# Patient Record
Sex: Male | Born: 1980 | Race: White | Hispanic: No | Marital: Single | Smoking: Never smoker
Health system: Southern US, Community
[De-identification: ages and names within clinical notes are randomized; demographics above are authoritative.]

## PROBLEM LIST (undated history)

## (undated) DIAGNOSIS — J9383 Other pneumothorax: Secondary | ICD-10-CM

## (undated) HISTORY — PX: LUNG SURGERY: SHX703

## (undated) HISTORY — PX: NASAL POLYP SURGERY: SHX186

## (undated) HISTORY — PX: CHEST TUBE INSERTION: SHX231

## (undated) HISTORY — DX: Other pneumothorax: J93.83

---

## 2019-04-21 ENCOUNTER — Other Ambulatory Visit: Payer: Self-pay | Admitting: Orthopedic Surgery

## 2019-04-21 DIAGNOSIS — M533 Sacrococcygeal disorders, not elsewhere classified: Secondary | ICD-10-CM

## 2019-05-12 ENCOUNTER — Ambulatory Visit
Admission: RE | Admit: 2019-05-12 | Discharge: 2019-05-12 | Disposition: A | Discharge status: Home / Self Care | Payer: Worker's Compensation | Source: Ambulatory Visit | Attending: Orthopedic Surgery | Admitting: Orthopedic Surgery

## 2019-05-12 ENCOUNTER — Other Ambulatory Visit: Payer: Self-pay

## 2019-05-12 DIAGNOSIS — M533 Sacrococcygeal disorders, not elsewhere classified: Secondary | ICD-10-CM

## 2019-05-27 ENCOUNTER — Encounter: Payer: Self-pay | Admitting: Physical Medicine & Rehabilitation

## 2019-05-27 ENCOUNTER — Encounter
Payer: Worker's Compensation | Attending: Physical Medicine & Rehabilitation | Admitting: Physical Medicine & Rehabilitation

## 2019-05-27 ENCOUNTER — Other Ambulatory Visit: Payer: Self-pay

## 2019-05-27 VITALS — BP 125/92 | HR 90 | Temp 98.2°F | Ht 73.0 in | Wt 222.0 lb

## 2019-05-27 DIAGNOSIS — R29898 Other symptoms and signs involving the musculoskeletal system: Secondary | ICD-10-CM

## 2019-05-27 DIAGNOSIS — M79605 Pain in left leg: Secondary | ICD-10-CM | POA: Diagnosis present

## 2019-05-27 DIAGNOSIS — M79604 Pain in right leg: Secondary | ICD-10-CM | POA: Insufficient documentation

## 2019-05-27 DIAGNOSIS — R208 Other disturbances of skin sensation: Secondary | ICD-10-CM | POA: Diagnosis present

## 2019-05-27 NOTE — Progress Notes (Signed)
Please see procedure tab for results

## 2019-07-22 ENCOUNTER — Other Ambulatory Visit: Payer: Self-pay | Admitting: Orthopedic Surgery

## 2019-07-22 DIAGNOSIS — G8929 Other chronic pain: Secondary | ICD-10-CM

## 2019-07-22 DIAGNOSIS — M545 Low back pain, unspecified: Secondary | ICD-10-CM

## 2019-07-27 ENCOUNTER — Other Ambulatory Visit: Payer: Self-pay | Admitting: Orthopedic Surgery

## 2019-07-27 DIAGNOSIS — G8929 Other chronic pain: Secondary | ICD-10-CM

## 2019-07-27 DIAGNOSIS — M545 Low back pain, unspecified: Secondary | ICD-10-CM

## 2019-08-07 ENCOUNTER — Other Ambulatory Visit: Payer: Self-pay

## 2019-08-07 ENCOUNTER — Ambulatory Visit
Admission: RE | Admit: 2019-08-07 | Discharge: 2019-08-07 | Disposition: A | Discharge status: Home / Self Care | Payer: Worker's Compensation | Source: Ambulatory Visit | Attending: Orthopedic Surgery | Admitting: Orthopedic Surgery

## 2019-08-07 ENCOUNTER — Other Ambulatory Visit: Payer: Self-pay | Admitting: Orthopedic Surgery

## 2019-08-07 DIAGNOSIS — G8929 Other chronic pain: Secondary | ICD-10-CM

## 2019-08-07 DIAGNOSIS — M545 Low back pain, unspecified: Secondary | ICD-10-CM

## 2019-08-07 MED ORDER — SODIUM CHLORIDE 0.9 % IV SOLN
Freq: Once | INTRAVENOUS | Status: AC
  Administered 2019-08-07: 08:00:00 via INTRAVENOUS

## 2019-08-07 MED ORDER — FENTANYL CITRATE (PF) 100 MCG/2ML IJ SOLN
25.0000 ug | INTRAMUSCULAR | Status: DC | PRN
  Administered 2019-08-07: 50 ug via INTRAVENOUS

## 2019-08-07 MED ORDER — IOPAMIDOL (ISOVUE-M 200) INJECTION 41%
3.5000 mL | Freq: Once | INTRAMUSCULAR | Status: AC
  Administered 2019-08-07: 3.5 mL

## 2019-08-07 MED ORDER — CEFAZOLIN SODIUM-DEXTROSE 2-4 GM/100ML-% IV SOLN
2.0000 g | INTRAVENOUS | Status: AC
  Administered 2019-08-07: 2 g via INTRAVENOUS

## 2019-08-07 MED ORDER — MIDAZOLAM HCL 2 MG/2ML IJ SOLN: 1.0000 mg | INTRAMUSCULAR | Status: DC | PRN

## 2019-08-07 MED ORDER — KETOROLAC TROMETHAMINE 30 MG/ML IJ SOLN
30.0000 mg | Freq: Once | INTRAMUSCULAR | Status: AC
  Administered 2019-08-07: 30 mg via INTRAVENOUS

## 2019-08-07 NOTE — Discharge Instructions (Signed)
Discogram Post Procedure Discharge Instructions ° °1. May resume a regular diet and any medications that you routinely take (including pain medications). °2. No driving day of procedure. °3. Upon discharge go home and rest for at least 4 hours.  May use an ice pack as needed to injection sites on back.  Ice to back 30 minutes on and 30 minutes off, all day. °4. May remove bandades later, today. °5. It is not unusual to be sore for several days after this procedure. ° ° ° °Please contact our office at [phone] for the following symptoms: ° °· Fever greater than 100 degrees °· Increased swelling, pain, or redness at injection site. ° ° °Thank you for visiting [facility]. ° ° °

## 2019-08-07 NOTE — Progress Notes (Signed)
Pt currently resting in nurses station. Pt tolerated procedure well. Is currently awake and speaking in complete sentences.

## 2019-08-26 ENCOUNTER — Other Ambulatory Visit: Payer: Self-pay | Admitting: Orthopedic Surgery

## 2019-09-04 NOTE — Pre-Procedure Instructions (Addendum)
[place], [place] [address] [place] [address] Phone: [phone] Fax: [phone]      Your procedure is scheduled on  09-09-19 Wednesday  Report to [facility] Main Entrance "A" at [place].M., and check in at the Admitting office.  Call this number if you have problems the morning of surgery:  [phone]  Call [phone] if you have any questions prior to your surgery date Monday-Friday 8am-4pm    Remember:  Do not eat or drink after midnight the night before your surgery  You may drink clear liquids until 0530 AM the morning of your surgery.   Clear liquids allowed are: Water, Non-Citrus Juices (without pulp), Carbonated Beverages, Clear Tea, Black Coffee Only, and Gatorade  Please complete your PRE-SURGERY ENSURE that was provided to you by 0530AM the morning of surgery.  Please, if able, drink it in one setting. DO NOT SIP.   Take these medicines the morning of surgery with A SIP OF WATER : acetaminophen-codeine (TYLENOL #3 as needed methocarbamol (ROBAXIN) as needed cyclobenzaprine (FLEXERIL) as needed cetirizine (ZYRTEC) as needed  7 days prior to surgery STOP taking any Aspirin (unless otherwise instructed by your surgeon), Aleve, Naproxen, Ibuprofen, Motrin, Advil, Goody's, BC's, all herbal medications, fish oil, and all vitamins.    The Morning of Surgery  Do not wear jewelry, make-up or nail polish.  Do not wear lotions, powders, or perfumes/colognes, or deodorant   Men may shave face and neck.  Do not bring valuables to the hospital.  [facility] is not responsible for any belongings or valuables.  If you are a smoker, DO NOT Smoke 24 hours prior to surgery IF you wear a CPAP at night please bring your mask, tubing, and machine the morning of surgery   Remember that you must have someone to transport you home after your surgery, and remain with you for 24 hours if you are discharged the same  day.   Contacts, glasses, hearing aids, dentures or bridgework may not be worn into surgery.    Leave your suitcase in the car.  After surgery it may be brought to your room.  For patients admitted to the hospital, discharge time will be determined by your treatment team.  Patients discharged the day of surgery will not be allowed to drive home.    Special instructions:   [place]- Preparing For Surgery  Before surgery, you can play an important role. Because skin is not sterile, your skin needs to be as free of germs as possible. You can reduce the number of germs on your skin by washing with CHG (chlorahexidine gluconate) Soap before surgery.  CHG is an antiseptic cleaner which kills germs and bonds with the skin to continue killing germs even after washing.    Oral Hygiene is also important to reduce your risk of infection.  Remember - BRUSH YOUR TEETH THE MORNING OF SURGERY WITH YOUR REGULAR TOOTHPASTE  Please do not use if you have an allergy to CHG or antibacterial soaps. If your skin becomes reddened/irritated stop using the CHG.  Do not shave (including legs and underarms) for at least 48 hours prior to first CHG shower. It is OK to shave your face.  Please follow these instructions carefully.   1. Shower the NIGHT BEFORE SURGERY and the MORNING OF SURGERY with CHG Soap.   2. If you chose to wash your hair, wash your hair first as usual with your normal shampoo.  3. After you shampoo, rinse your hair and body thoroughly to remove the shampoo.  4. Use CHG as you would any other liquid soap. You can apply CHG directly to the skin and wash gently with a scrungie or a clean washcloth.   5. Apply the CHG Soap to your body ONLY FROM THE NECK DOWN.  Do not use on open wounds or open sores. Avoid contact with your eyes, ears, mouth and genitals (private parts). Wash Face and genitals (private parts)  with your normal soap.   6. Wash thoroughly, paying special attention to the  area where your surgery will be performed.  7. Thoroughly rinse your body with warm water from the neck down.  8. DO NOT shower/wash with your normal soap after using and rinsing off the CHG Soap.  9. Pat yourself dry with a CLEAN TOWEL.  10. Wear CLEAN PAJAMAS to bed the night before surgery, wear comfortable clothes the morning of surgery  11. Place CLEAN SHEETS on your bed the night of your first shower and DO NOT SLEEP WITH PETS.  Day of Surgery:  Do not apply any deodorants/lotions. Please shower the morning of surgery with the CHG soap  Please wear clean clothes to the hospital/surgery center.   Remember to brush your teeth WITH YOUR REGULAR TOOTHPASTE.  Please read over the  fact sheets that you were given.

## 2019-09-07 ENCOUNTER — Encounter (HOSPITAL_COMMUNITY): Payer: Self-pay

## 2019-09-07 ENCOUNTER — Other Ambulatory Visit: Payer: Self-pay

## 2019-09-07 ENCOUNTER — Other Ambulatory Visit (HOSPITAL_COMMUNITY)
Admission: RE | Admit: 2019-09-07 | Discharge: 2019-09-07 | Disposition: A | Discharge status: Home / Self Care | Payer: Worker's Compensation | Source: Ambulatory Visit | Attending: Orthopedic Surgery | Admitting: Orthopedic Surgery

## 2019-09-07 ENCOUNTER — Encounter (HOSPITAL_COMMUNITY)
Admission: RE | Admit: 2019-09-07 | Discharge: 2019-09-07 | Disposition: A | Discharge status: Home / Self Care | Payer: Worker's Compensation | Source: Ambulatory Visit | Attending: Orthopedic Surgery | Admitting: Orthopedic Surgery

## 2019-09-07 LAB — CBC WITH DIFFERENTIAL/PLATELET
Abs Immature Granulocytes: 0.07 10*3/uL (ref 0.00–0.07)
Basophils Absolute: 0.1 10*3/uL (ref 0.0–0.1)
Basophils Relative: 1 %
Eosinophils Absolute: 0.3 10*3/uL (ref 0.0–0.5)
Eosinophils Relative: 3 %
HCT: 51 % (ref 39.0–52.0)
Hemoglobin: 17.3 g/dL — ABNORMAL HIGH (ref 13.0–17.0)
Immature Granulocytes: 1 %
Lymphocytes Relative: 18 %
Lymphs Abs: 2 10*3/uL (ref 0.7–4.0)
MCH: 28.4 pg (ref 26.0–34.0)
MCHC: 33.9 g/dL (ref 30.0–36.0)
MCV: 83.6 fL (ref 80.0–100.0)
Monocytes Absolute: 1 10*3/uL (ref 0.1–1.0)
Monocytes Relative: 9 %
Neutro Abs: 7.6 10*3/uL (ref 1.7–7.7)
Neutrophils Relative %: 68 %
Platelets: 298 10*3/uL (ref 150–400)
RBC: 6.1 MIL/uL — ABNORMAL HIGH (ref 4.22–5.81)
RDW: 12.6 % (ref 11.5–15.5)
WBC: 11.1 10*3/uL — ABNORMAL HIGH (ref 4.0–10.5)
nRBC: 0 % (ref 0.0–0.2)

## 2019-09-07 LAB — URINALYSIS, ROUTINE W REFLEX MICROSCOPIC
Bilirubin Urine: NEGATIVE
Glucose, UA: NEGATIVE mg/dL
Hgb urine dipstick: NEGATIVE
Ketones, ur: NEGATIVE mg/dL
Leukocytes,Ua: NEGATIVE
Nitrite: NEGATIVE
Protein, ur: NEGATIVE mg/dL
Specific Gravity, Urine: 1.024 (ref 1.005–1.030)
pH: 5 (ref 5.0–8.0)

## 2019-09-07 LAB — COMPREHENSIVE METABOLIC PANEL
ALT: 42 U/L (ref 0–44)
AST: 27 U/L (ref 15–41)
Albumin: 4.4 g/dL (ref 3.5–5.0)
Alkaline Phosphatase: 83 U/L (ref 38–126)
Anion gap: 12 (ref 5–15)
BUN: 9 mg/dL (ref 6–20)
CO2: 25 mmol/L (ref 22–32)
Calcium: 9.5 mg/dL (ref 8.9–10.3)
Chloride: 101 mmol/L (ref 98–111)
Creatinine, Ser: 1.2 mg/dL (ref 0.61–1.24)
GFR calc Af Amer: >60 mL/min (ref >60)
GFR calc non Af Amer: >60 mL/min (ref >60)
Glucose, Bld: 105 mg/dL — ABNORMAL HIGH (ref 70–99)
Potassium: 4.2 mmol/L (ref 3.5–5.1)
Sodium: 138 mmol/L (ref 135–145)
Total Bilirubin: 1 mg/dL (ref 0.3–1.2)
Total Protein: 7.3 g/dL (ref 6.5–8.1)

## 2019-09-07 LAB — SURGICAL PCR SCREEN
MRSA, PCR: NEGATIVE
Staphylococcus aureus: POSITIVE — AB

## 2019-09-07 LAB — PROTIME-INR
INR: 1.1 (ref 0.8–1.2)
Prothrombin Time: 13.7 seconds (ref 11.4–15.2)

## 2019-09-07 LAB — TYPE AND SCREEN
ABO/RH(D): O NEG
Antibody Screen: NEGATIVE

## 2019-09-07 LAB — SARS CORONAVIRUS 2 (TAT 6-24 HRS): SARS Coronavirus 2: NEGATIVE

## 2019-09-07 LAB — APTT: aPTT: 30 seconds (ref 24–36)

## 2019-09-07 LAB — ABO/RH: ABO/RH(D): O NEG

## 2019-09-07 NOTE — Progress Notes (Addendum)
  Coronavirus Screening Scheduled for Covid test today. Have you experienced the following symptoms:  Cough yes/no: No Fever (>100.58F)  yes/no: No Runny nose yes/no: No Sore throat yes/no: No Difficulty breathing/shortness of breath  yes/no: No Loss of smell or tatse-No Have you or a family member traveled in the last 14 days and where? yes/no: No  PCP - denies  Cardiologist - denies  Chest x-ray - NA  EKG - NA  Stress Test - denies  ECHO - denies  Cardiac Cath - denies  AICD-denies PM-denies LOOP-denies  Sleep Study -denies  CPAP - NA  LABS-PCR,UA,CBC diff,PT-INR,CMP,APTT  ASA-denies  ERAS-Pre-surgery Ensure given with instructions.  HA1C-denies Fasting Blood Sugar -  Checks Blood Sugar _____ times a day  Anesthesia-N  Pt denies having chest pain, sob, or fever at this time. All instructions explained to the pt, with a verbal understanding of the material. Pt agrees to go over the instructions while at home for a better understanding. Pt also instructed to self quarantine after being tested for COVID-19. The opportunity to ask questions was provided.

## 2019-09-09 ENCOUNTER — Other Ambulatory Visit: Payer: Self-pay

## 2019-09-09 ENCOUNTER — Inpatient Hospital Stay (HOSPITAL_COMMUNITY): Payer: Worker's Compensation

## 2019-09-09 ENCOUNTER — Inpatient Hospital Stay (HOSPITAL_COMMUNITY)
Admission: RE | Admit: 2019-09-09 | Discharge: 2019-09-11 | DRG: 454 | Disposition: A | Discharge status: Other Acute Inpatient Hospital | Payer: Worker's Compensation | Source: Ambulatory Visit | Attending: Orthopedic Surgery | Admitting: Orthopedic Surgery

## 2019-09-09 ENCOUNTER — Inpatient Hospital Stay (HOSPITAL_COMMUNITY): Payer: Worker's Compensation | Admitting: Certified Registered Nurse Anesthetist

## 2019-09-09 ENCOUNTER — Encounter (HOSPITAL_COMMUNITY)
Admission: RE | Disposition: A | Discharge status: Other Acute Inpatient Hospital | Payer: Self-pay | Source: Home / Self Care | Attending: Orthopedic Surgery

## 2019-09-09 ENCOUNTER — Encounter (HOSPITAL_COMMUNITY): Payer: Self-pay

## 2019-09-09 DIAGNOSIS — Z91048 Other nonmedicinal substance allergy status: Secondary | ICD-10-CM | POA: Diagnosis not present

## 2019-09-09 DIAGNOSIS — Z915 Personal history of self-harm: Secondary | ICD-10-CM

## 2019-09-09 DIAGNOSIS — M545 Low back pain: Secondary | ICD-10-CM | POA: Diagnosis present

## 2019-09-09 DIAGNOSIS — Z01812 Encounter for preprocedural laboratory examination: Secondary | ICD-10-CM | POA: Diagnosis not present

## 2019-09-09 DIAGNOSIS — R45851 Suicidal ideations: Secondary | ICD-10-CM | POA: Diagnosis present

## 2019-09-09 DIAGNOSIS — Z91040 Latex allergy status: Secondary | ICD-10-CM | POA: Diagnosis not present

## 2019-09-09 DIAGNOSIS — M5116 Intervertebral disc disorders with radiculopathy, lumbar region: Secondary | ICD-10-CM | POA: Diagnosis present

## 2019-09-09 DIAGNOSIS — Z20828 Contact with and (suspected) exposure to other viral communicable diseases: Secondary | ICD-10-CM | POA: Diagnosis present

## 2019-09-09 DIAGNOSIS — M5137 Other intervertebral disc degeneration, lumbosacral region: Secondary | ICD-10-CM | POA: Diagnosis present

## 2019-09-09 DIAGNOSIS — M5410 Radiculopathy, site unspecified: Secondary | ICD-10-CM | POA: Diagnosis present

## 2019-09-09 DIAGNOSIS — Z79899 Other long term (current) drug therapy: Secondary | ICD-10-CM

## 2019-09-09 DIAGNOSIS — Z419 Encounter for procedure for purposes other than remedying health state, unspecified: Secondary | ICD-10-CM

## 2019-09-09 HISTORY — PX: TRANSFORAMINAL LUMBAR INTERBODY FUSION (TLIF) WITH PEDICLE SCREW FIXATION 1 LEVEL: SHX6141

## 2019-09-09 SURGERY — TRANSFORAMINAL LUMBAR INTERBODY FUSION (TLIF) WITH PEDICLE SCREW FIXATION 1 LEVEL
Anesthesia: General | Laterality: Left

## 2019-09-09 MED ORDER — DIAZEPAM 5 MG PO TABS
5.0000 mg | ORAL_TABLET | Freq: Four times a day (QID) | ORAL | Status: DC | PRN
  Administered 2019-09-09 – 2019-09-11 (×4): 5 mg via ORAL
  Filled 2019-09-09 (×4): qty 1

## 2019-09-09 MED ORDER — PROMETHAZINE HCL 25 MG/ML IJ SOLN: 6.2500 mg | INTRAMUSCULAR | Status: DC | PRN

## 2019-09-09 MED ORDER — ROCURONIUM BROMIDE 10 MG/ML (PF) SYRINGE
PREFILLED_SYRINGE | INTRAVENOUS | Status: DC | PRN
  Administered 2019-09-09: 50 mg via INTRAVENOUS
  Administered 2019-09-09: 10 mg via INTRAVENOUS
  Administered 2019-09-09: 40 mg via INTRAVENOUS

## 2019-09-09 MED ORDER — ONDANSETRON HCL 4 MG/2ML IJ SOLN
INTRAMUSCULAR | Status: DC | PRN
  Administered 2019-09-09: 4 mg via INTRAVENOUS

## 2019-09-09 MED ORDER — METHYLENE BLUE 0.5 % INJ SOLN
INTRAVENOUS | Status: AC
  Filled 2019-09-09: qty 10

## 2019-09-09 MED ORDER — PROPOFOL 10 MG/ML IV BOLUS
INTRAVENOUS | Status: DC | PRN
  Administered 2019-09-09: 200 mg via INTRAVENOUS

## 2019-09-09 MED ORDER — ACETAMINOPHEN 325 MG PO TABS: 650.0000 mg | ORAL_TABLET | ORAL | Status: DC | PRN

## 2019-09-09 MED ORDER — MIDAZOLAM HCL 2 MG/2ML IJ SOLN
INTRAMUSCULAR | Status: AC
  Filled 2019-09-09: qty 2

## 2019-09-09 MED ORDER — LIDOCAINE 2% (20 MG/ML) 5 ML SYRINGE
INTRAMUSCULAR | Status: DC | PRN
  Administered 2019-09-09: 30 mg via INTRAVENOUS

## 2019-09-09 MED ORDER — POVIDONE-IODINE 7.5 % EX SOLN
Freq: Once | CUTANEOUS | Status: DC
  Filled 2019-09-09: qty 118

## 2019-09-09 MED ORDER — CEFAZOLIN SODIUM-DEXTROSE 2-4 GM/100ML-% IV SOLN
2.0000 g | Freq: Three times a day (TID) | INTRAVENOUS | Status: AC
  Administered 2019-09-09 – 2019-09-10 (×2): 2 g via INTRAVENOUS
  Filled 2019-09-09 (×2): qty 100

## 2019-09-09 MED ORDER — THROMBIN 20000 UNITS EX SOLR
CUTANEOUS | Status: AC
  Filled 2019-09-09: qty 20000

## 2019-09-09 MED ORDER — SODIUM CHLORIDE 0.9% FLUSH: 3.0000 mL | Freq: Two times a day (BID) | INTRAVENOUS | Status: DC

## 2019-09-09 MED ORDER — ZOLPIDEM TARTRATE 5 MG PO TABS: 5.0000 mg | ORAL_TABLET | Freq: Every evening | ORAL | Status: DC | PRN

## 2019-09-09 MED ORDER — BUPIVACAINE-EPINEPHRINE (PF) 0.25% -1:200000 IJ SOLN
INTRAMUSCULAR | Status: AC
  Filled 2019-09-09: qty 30

## 2019-09-09 MED ORDER — PROPOFOL 500 MG/50ML IV EMUL
INTRAVENOUS | Status: DC | PRN
  Administered 2019-09-09: 50 ug/kg/min via INTRAVENOUS

## 2019-09-09 MED ORDER — DEXMEDETOMIDINE HCL 200 MCG/2ML IV SOLN
INTRAVENOUS | Status: DC | PRN
  Administered 2019-09-09 (×2): 12 ug via INTRAVENOUS

## 2019-09-09 MED ORDER — HYDROXYZINE HCL 50 MG/ML IM SOLN
50.0000 mg | Freq: Four times a day (QID) | INTRAMUSCULAR | Status: DC | PRN
  Administered 2019-09-09: 18:00:00 50 mg via INTRAMUSCULAR
  Filled 2019-09-09: qty 1

## 2019-09-09 MED ORDER — PROPOFOL 10 MG/ML IV BOLUS
INTRAVENOUS | Status: AC
  Filled 2019-09-09: qty 40

## 2019-09-09 MED ORDER — KETAMINE HCL 50 MG/5ML IJ SOSY
PREFILLED_SYRINGE | INTRAMUSCULAR | Status: AC
  Filled 2019-09-09: qty 5

## 2019-09-09 MED ORDER — 0.9 % SODIUM CHLORIDE (POUR BTL) OPTIME
TOPICAL | Status: DC | PRN
  Administered 2019-09-09 (×2): 1000 mL

## 2019-09-09 MED ORDER — DOCUSATE SODIUM 100 MG PO CAPS
100.0000 mg | ORAL_CAPSULE | Freq: Two times a day (BID) | ORAL | Status: DC
  Administered 2019-09-09 – 2019-09-10 (×3): 100 mg via ORAL
  Filled 2019-09-09 (×3): qty 1

## 2019-09-09 MED ORDER — FENTANYL CITRATE (PF) 250 MCG/5ML IJ SOLN
INTRAMUSCULAR | Status: DC | PRN
  Administered 2019-09-09: 25 ug via INTRAVENOUS
  Administered 2019-09-09: 250 ug via INTRAVENOUS
  Administered 2019-09-09: 25 ug via INTRAVENOUS

## 2019-09-09 MED ORDER — ONDANSETRON HCL 4 MG/2ML IJ SOLN
INTRAMUSCULAR | Status: AC
  Filled 2019-09-09: qty 2

## 2019-09-09 MED ORDER — FENTANYL CITRATE (PF) 250 MCG/5ML IJ SOLN
INTRAMUSCULAR | Status: AC
  Filled 2019-09-09: qty 5

## 2019-09-09 MED ORDER — SENNOSIDES-DOCUSATE SODIUM 8.6-50 MG PO TABS
1.0000 | ORAL_TABLET | Freq: Every evening | ORAL | Status: DC | PRN
  Administered 2019-09-09: 1 via ORAL
  Filled 2019-09-09: qty 1

## 2019-09-09 MED ORDER — MEPERIDINE HCL 25 MG/ML IJ SOLN: 6.2500 mg | INTRAMUSCULAR | Status: DC | PRN

## 2019-09-09 MED ORDER — ROCURONIUM BROMIDE 10 MG/ML (PF) SYRINGE
PREFILLED_SYRINGE | INTRAVENOUS | Status: AC
  Filled 2019-09-09: qty 10

## 2019-09-09 MED ORDER — KETAMINE HCL 10 MG/ML IJ SOLN
INTRAMUSCULAR | Status: DC | PRN
  Administered 2019-09-09 (×2): 10 mg via INTRAVENOUS
  Administered 2019-09-09: 30 mg via INTRAVENOUS

## 2019-09-09 MED ORDER — HYDROMORPHONE HCL 1 MG/ML IJ SOLN
0.2500 mg | INTRAMUSCULAR | Status: DC | PRN
  Administered 2019-09-09 (×4): 0.5 mg via INTRAVENOUS

## 2019-09-09 MED ORDER — DEXAMETHASONE SODIUM PHOSPHATE 10 MG/ML IJ SOLN
INTRAMUSCULAR | Status: DC | PRN
  Administered 2019-09-09: 10 mg via INTRAVENOUS

## 2019-09-09 MED ORDER — DEXAMETHASONE SODIUM PHOSPHATE 10 MG/ML IJ SOLN
INTRAMUSCULAR | Status: AC
  Filled 2019-09-09: qty 1

## 2019-09-09 MED ORDER — ONDANSETRON HCL 4 MG PO TABS: 4.0000 mg | ORAL_TABLET | Freq: Four times a day (QID) | ORAL | Status: DC | PRN

## 2019-09-09 MED ORDER — BUPIVACAINE LIPOSOME 1.3 % IJ SUSP
20.0000 mL | Freq: Once | INTRAMUSCULAR | Status: DC
  Filled 2019-09-09: qty 20

## 2019-09-09 MED ORDER — ALUM & MAG HYDROXIDE-SIMETH 200-200-20 MG/5ML PO SUSP: 30.0000 mL | Freq: Four times a day (QID) | ORAL | Status: DC | PRN

## 2019-09-09 MED ORDER — LORATADINE 10 MG PO TABS
10.0000 mg | ORAL_TABLET | Freq: Every day | ORAL | Status: DC
  Administered 2019-09-10: 10 mg via ORAL
  Filled 2019-09-09: qty 1

## 2019-09-09 MED ORDER — METHYLENE BLUE 0.5 % INJ SOLN
INTRAVENOUS | Status: DC | PRN
  Administered 2019-09-09: 1 mL

## 2019-09-09 MED ORDER — THROMBIN 20000 UNITS EX SOLR
CUTANEOUS | Status: DC | PRN
  Administered 2019-09-09: 09:00:00 20 mL via TOPICAL

## 2019-09-09 MED ORDER — CEFAZOLIN SODIUM-DEXTROSE 2-4 GM/100ML-% IV SOLN
2.0000 g | INTRAVENOUS | Status: AC
  Administered 2019-09-09: 09:00:00 2 g via INTRAVENOUS
  Filled 2019-09-09: qty 100

## 2019-09-09 MED ORDER — LIDOCAINE 2% (20 MG/ML) 5 ML SYRINGE
INTRAMUSCULAR | Status: AC
  Filled 2019-09-09: qty 5

## 2019-09-09 MED ORDER — SODIUM CHLORIDE 0.9 % IV SOLN: 250.0000 mL | INTRAVENOUS | Status: DC

## 2019-09-09 MED ORDER — SUGAMMADEX SODIUM 200 MG/2ML IV SOLN
INTRAVENOUS | Status: DC | PRN
  Administered 2019-09-09: 200 mg via INTRAVENOUS

## 2019-09-09 MED ORDER — BUPIVACAINE LIPOSOME 1.3 % IJ SUSP
INTRAMUSCULAR | Status: DC | PRN
  Administered 2019-09-09: 20 mL

## 2019-09-09 MED ORDER — MIDAZOLAM HCL 2 MG/2ML IJ SOLN: 0.5000 mg | Freq: Once | INTRAMUSCULAR | Status: DC | PRN

## 2019-09-09 MED ORDER — ONDANSETRON HCL 4 MG/2ML IJ SOLN
4.0000 mg | Freq: Four times a day (QID) | INTRAMUSCULAR | Status: DC | PRN
  Administered 2019-09-09: 16:00:00 4 mg via INTRAVENOUS
  Filled 2019-09-09: qty 2

## 2019-09-09 MED ORDER — ACETAMINOPHEN 650 MG RE SUPP: 650.0000 mg | RECTAL | Status: DC | PRN

## 2019-09-09 MED ORDER — LACTATED RINGERS IV SOLN
INTRAVENOUS | Status: DC | PRN
  Administered 2019-09-09 (×2): via INTRAVENOUS

## 2019-09-09 MED ORDER — FLEET ENEMA 7-19 GM/118ML RE ENEM: 1.0000 | ENEMA | Freq: Once | RECTAL | Status: DC | PRN

## 2019-09-09 MED ORDER — OXYCODONE-ACETAMINOPHEN 5-325 MG PO TABS
1.0000 | ORAL_TABLET | ORAL | Status: DC | PRN
  Administered 2019-09-09 – 2019-09-11 (×11): 2 via ORAL
  Filled 2019-09-09 (×11): qty 2

## 2019-09-09 MED ORDER — PHENOL 1.4 % MT LIQD: 1.0000 | OROMUCOSAL | Status: DC | PRN

## 2019-09-09 MED ORDER — SODIUM CHLORIDE 0.9 % IV SOLN
INTRAVENOUS | Status: DC | PRN
  Administered 2019-09-09: 09:00:00 25 ug/min via INTRAVENOUS
  Administered 2019-09-09: 10:00:00 30 ug/min via INTRAVENOUS

## 2019-09-09 MED ORDER — POTASSIUM CHLORIDE IN NACL 20-0.9 MEQ/L-% IV SOLN: INTRAVENOUS | Status: DC

## 2019-09-09 MED ORDER — MORPHINE SULFATE (PF) 2 MG/ML IV SOLN
1.0000 mg | INTRAVENOUS | Status: DC | PRN
  Administered 2019-09-09: 2 mg via INTRAVENOUS
  Filled 2019-09-09: qty 1

## 2019-09-09 MED ORDER — BISACODYL 5 MG PO TBEC: 5.0000 mg | DELAYED_RELEASE_TABLET | Freq: Every day | ORAL | Status: DC | PRN

## 2019-09-09 MED ORDER — HYDROMORPHONE HCL 1 MG/ML IJ SOLN
INTRAMUSCULAR | Status: AC
  Filled 2019-09-09: qty 2

## 2019-09-09 MED ORDER — BUPIVACAINE-EPINEPHRINE 0.25% -1:200000 IJ SOLN
INTRAMUSCULAR | Status: DC | PRN
  Administered 2019-09-09: 20 mL
  Administered 2019-09-09: 10 mL

## 2019-09-09 MED ORDER — MENTHOL 3 MG MT LOZG: 1.0000 | LOZENGE | OROMUCOSAL | Status: DC | PRN

## 2019-09-09 MED ORDER — MIDAZOLAM HCL 2 MG/2ML IJ SOLN
INTRAMUSCULAR | Status: DC | PRN
  Administered 2019-09-09: 2 mg via INTRAVENOUS

## 2019-09-09 MED ORDER — SODIUM CHLORIDE 0.9% FLUSH: 3.0000 mL | INTRAVENOUS | Status: DC | PRN

## 2019-09-09 SURGICAL SUPPLY — 91 items
BENZOIN TINCTURE PRP APPL 2/3 (GAUZE/BANDAGES/DRESSINGS) ×3 IMPLANT
BLADE CLIPPER SURG (BLADE) ×3 IMPLANT
BONE VIVIGEN FORMABLE 10CC (Bone Implant) ×3 IMPLANT
BUR PRESCISION 1.7 ELITE (BURR) ×3 IMPLANT
BUR ROUND FLUTED 5 RND (BURR) ×2 IMPLANT
BUR ROUND FLUTED 5MM RND (BURR) ×1
BUR ROUND PRECISION 4.0 (BURR) IMPLANT
BUR ROUND PRECISION 4.0MM (BURR)
BUR SABER RD CUTTING 3.0 (BURR) IMPLANT
BUR SABER RD CUTTING 3.0MM (BURR)
CARTRIDGE OIL MAESTRO DRILL (MISCELLANEOUS) ×1 IMPLANT
CATH FOLEY LATEX FREE 16FR (CATHETERS) ×2
CATH FOLEY LF 16FR (CATHETERS) ×1 IMPLANT
CLOSURE WOUND 1/2 X4 (GAUZE/BANDAGES/DRESSINGS) ×1
CONT SPEC 4OZ CLIKSEAL STRL BL (MISCELLANEOUS) ×3 IMPLANT
COVER BACK TABLE 60X90IN (DRAPES) ×3 IMPLANT
COVER MAYO STAND STRL (DRAPES) IMPLANT
COVER SURGICAL LIGHT HANDLE (MISCELLANEOUS) IMPLANT
COVER WAND RF STERILE (DRAPES) ×3 IMPLANT
DIFFUSER DRILL AIR PNEUMATIC (MISCELLANEOUS) ×3 IMPLANT
DRAIN CHANNEL 15F RND FF W/TCR (WOUND CARE) IMPLANT
DRAPE C-ARM 42X72 X-RAY (DRAPES) ×3 IMPLANT
DRAPE C-ARMOR (DRAPES) ×3 IMPLANT
DRAPE POUCH INSTRU U-SHP 10X18 (DRAPES) ×3 IMPLANT
DRAPE SURG 17X23 STRL (DRAPES) ×12 IMPLANT
DURAPREP 26ML APPLICATOR (WOUND CARE) ×3 IMPLANT
ELECT BLADE 4.0 EZ CLEAN MEGAD (MISCELLANEOUS) ×3
ELECT CAUTERY BLADE 6.4 (BLADE) ×3 IMPLANT
ELECT REM PT RETURN 9FT ADLT (ELECTROSURGICAL) ×3
ELECTRODE BLDE 4.0 EZ CLN MEGD (MISCELLANEOUS) ×1 IMPLANT
ELECTRODE REM PT RTRN 9FT ADLT (ELECTROSURGICAL) ×1 IMPLANT
EVACUATOR SILICONE 100CC (DRAIN) IMPLANT
FEE INTRAOP MONITOR IMPULS NCS (MISCELLANEOUS) ×1 IMPLANT
FILTER STRAW FLUID ASPIR (MISCELLANEOUS) ×3 IMPLANT
GAUZE 4X4 16PLY RFD (DISPOSABLE) ×3 IMPLANT
GAUZE SPONGE 4X4 12PLY STRL (GAUZE/BANDAGES/DRESSINGS) ×3 IMPLANT
GLOVE BIO SURGEON STRL SZ7 (GLOVE) IMPLANT
GLOVE BIO SURGEON STRL SZ8 (GLOVE) IMPLANT
GLOVE BIOGEL PI IND STRL 7.0 (GLOVE) ×1 IMPLANT
GLOVE BIOGEL PI IND STRL 8 (GLOVE) ×2 IMPLANT
GLOVE BIOGEL PI INDICATOR 7.0 (GLOVE) ×2
GLOVE BIOGEL PI INDICATOR 8 (GLOVE) ×4
GLOVE SURG SS PI 7.0 STRL IVOR (GLOVE) ×3 IMPLANT
GLOVE SURG SS PI 8.0 STRL IVOR (GLOVE) ×3 IMPLANT
GOWN STRL REUS W/ TWL LRG LVL3 (GOWN DISPOSABLE) ×3 IMPLANT
GOWN STRL REUS W/ TWL XL LVL3 (GOWN DISPOSABLE) ×1 IMPLANT
GOWN STRL REUS W/TWL LRG LVL3 (GOWN DISPOSABLE) ×6
GOWN STRL REUS W/TWL XL LVL3 (GOWN DISPOSABLE) ×2
INTRAOP MONITOR FEE IMPULS NCS (MISCELLANEOUS) ×1
INTRAOP MONITOR FEE IMPULSE (MISCELLANEOUS) ×2
IV CATH 14GX2 1/4 (CATHETERS) ×3 IMPLANT
KIT BASIN OR (CUSTOM PROCEDURE TRAY) ×3 IMPLANT
KIT POSITION SURG JACKSON T1 (MISCELLANEOUS) ×3 IMPLANT
KIT TURNOVER KIT B (KITS) ×3 IMPLANT
MARKER SKIN DUAL TIP RULER LAB (MISCELLANEOUS) ×6 IMPLANT
NEEDLE 18GX1X1/2 (RX/OR ONLY) (NEEDLE) ×3 IMPLANT
NEEDLE 22X1 1/2 (OR ONLY) (NEEDLE) ×6 IMPLANT
NEEDLE HYPO 25GX1X1/2 BEV (NEEDLE) ×3 IMPLANT
NEEDLE SPNL 18GX3.5 QUINCKE PK (NEEDLE) ×6 IMPLANT
NS IRRIG 1000ML POUR BTL (IV SOLUTION) ×9 IMPLANT
OIL CARTRIDGE MAESTRO DRILL (MISCELLANEOUS) ×3
PACK LAMINECTOMY ORTHO (CUSTOM PROCEDURE TRAY) ×3 IMPLANT
PACK UNIVERSAL I (CUSTOM PROCEDURE TRAY) ×3 IMPLANT
PAD ARMBOARD 7.5X6 YLW CONV (MISCELLANEOUS) IMPLANT
PATTIES SURGICAL .5 X1 (DISPOSABLE) ×3 IMPLANT
PATTIES SURGICAL .5X1.5 (GAUZE/BANDAGES/DRESSINGS) ×3 IMPLANT
PROBE PEDCLE PROBE MAGSTM DISP (MISCELLANEOUS) ×3 IMPLANT
ROD PRE BENT EXPEDIUM 35MM (Rod) ×6 IMPLANT
SCREW CORTICAL VIPER 7X40MM (Screw) ×6 IMPLANT
SCREW SET SINGLE INNER (Screw) ×12 IMPLANT
SCREW VIPER CORT FIX 6.00X30 (Screw) ×6 IMPLANT
SPACER EX PROLFT 10X28X8 SM 7D (Spacer) ×1 IMPLANT
SPACER EXP PROLIFT 10X28 SM 7D (Spacer) ×2 IMPLANT
SPONGE INTESTINAL PEANUT (DISPOSABLE) ×3 IMPLANT
SPONGE SURGIFOAM ABS GEL 100 (HEMOSTASIS) ×3 IMPLANT
STRIP CLOSURE SKIN 1/2X4 (GAUZE/BANDAGES/DRESSINGS) ×2 IMPLANT
SURGIFLO W/THROMBIN 8M KIT (HEMOSTASIS) IMPLANT
SUT MNCRL AB 4-0 PS2 18 (SUTURE) ×3 IMPLANT
SUT VIC AB 0 CT1 18XCR BRD 8 (SUTURE) IMPLANT
SUT VIC AB 0 CT1 8-18 (SUTURE)
SUT VIC AB 1 CT1 18XCR BRD 8 (SUTURE) ×1 IMPLANT
SUT VIC AB 1 CT1 8-18 (SUTURE) ×2
SUT VIC AB 2-0 CT2 18 VCP726D (SUTURE) ×3 IMPLANT
SYR 20ML LL LF (SYRINGE) ×6 IMPLANT
SYR BULB IRRIGATION 50ML (SYRINGE) ×3 IMPLANT
SYR CONTROL 10ML LL (SYRINGE) ×6 IMPLANT
SYR TB 1ML LUER SLIP (SYRINGE) ×3 IMPLANT
TAPE PAPER 3X10 WHT MICROPORE (GAUZE/BANDAGES/DRESSINGS) ×3 IMPLANT
TRAY FOLEY MTR SLVR 16FR STAT (SET/KITS/TRAYS/PACK) ×3 IMPLANT
WATER STERILE IRR 1000ML POUR (IV SOLUTION) ×3 IMPLANT
YANKAUER SUCT BULB TIP NO VENT (SUCTIONS) ×3 IMPLANT

## 2019-09-09 NOTE — Progress Notes (Signed)
I just spoke with patient's nurse, Caryl Pina. She described to me what is documented in patient's note from her entered at 3:24. He now has a Actuary and psychiatry will be consulted.

## 2019-09-09 NOTE — Transfer of Care (Signed)
Immediate Anesthesia Transfer of Care Note  Patient: Benjamin Hernandez  Procedure(s) Performed: LEFT-SIDED LUMBAR FIVE- SACRUM ONE TRANSFORAMINAL LUMBAR INTERBODY FUSION WITH INSTRUMENTATION AND ALLOGRAFT (Left )  Patient Location: PACU  Anesthesia Type:General  Level of Consciousness: awake, alert  and oriented  Airway & Oxygen Therapy: Patient Spontanous Breathing and Patient connected to nasal cannula oxygen  Post-op Assessment: Report given to RN, Post -op Vital signs reviewed and stable and Patient moving all extremities X 4  Post vital signs: Reviewed and stable  Last Vitals:  Vitals Value Taken Time  BP    Temp    Pulse 101 09/09/19 1201  Resp 33 09/09/19 1201  SpO2 98 % 09/09/19 1201  Vitals shown include unvalidated device data.  Last Pain:  Vitals:   09/09/19 0654  TempSrc:   PainSc: 8       Patients Stated Pain Goal: 3 ([id number])  Complications: No apparent anesthesia complications

## 2019-09-09 NOTE — Progress Notes (Signed)
Pt admitted to 3C11 from PACU. Pt was checked in and during my assessment he began discussing some personal issues. Pt told me that he was very depressed and he has attempted to kill himself x 2. The most recent attempt was last week by playing Bernie. He stated that he took all the bullets out of his gun and then placed one back in and spun it around. He then pulled the trigger once and nothing happened and he became angry that nothing happened so he pulled the trigger once more. Pt stated that he doesn't care if he lives or dies because he is tired of living in pain. The sister also spoke with me about these concerns because he told her last night the same story that he had just told me. Pt's sister is afraid for him to go home alone. I will notify Dr. Lynann Bologna and place the Pt on suicide precautions. Will continue to monitor Pt. Holli Humbles, RN

## 2019-09-09 NOTE — Op Note (Signed)
PATIENT NAME: Benjamin Hernandez   MEDICAL RECORD NO.:   [id number]   DATE OF BIRTH: March 11, 1981   DATE OF PROCEDURE: 09/09/2019                               OPERATIVE REPORT   PREOPERATIVE DIAGNOSES: 1. Low back pain, bilateral leg pain 2. L5-S1 degenerative disc disease   POSTOPERATIVE DIAGNOSES: 1. Low back pain, bilateral leg pain 2. L5-S1 degenerative disc disease   PROCEDURES: 1. Left-sided L5-S1 transforaminal lumbar interbody fusion. 2. Right-sided L5-S1 posterolateral fusion. 3. Insertion of interbody device x1 (Prolift expandable spacer, expanded to 96mm)     intervertebral spacer). 4. Placement of posterior instrumentation at L5, S1 bilaterally. 5. Use of local autograft. 6. Use of morselized allograft - ViviGen. 7. Intraoperative use of fluoroscopy.   SURGEON:  Phylliss Bob, MD.   ASSISTANTPricilla Holm, PA-C.   ANESTHESIA:  General endotracheal anesthesia.   COMPLICATIONS:  None.   DISPOSITION:  Stable.   ESTIMATED BLOOD LOSS:   200cc   INDICATIONS FOR SURGERY:  Briefly, Mr. Dommer is a pleasant 38 year old male who did present to me with severe and ongoing pain in the back and legs s/p a work injury. His workup and treatment has been extensive. Of note, a discogram did readily reproduce his back pain and leg pain at L5/S1, with negative control levels. We did have an extensive discussion about surgery, and he did elect to proceed with the procedure noted above.      OPERATIVE DETAILS:  On 09/09/2019, the patient was brought to surgery and general endotracheal anesthesia was administered.  The patient was placed prone on a well-padded flat Jackson bed with a spinal frame.  Antibiotics were given and a time-out procedure was performed. The back was prepped and draped in the usual fashion.  A midline incision was made overlying the L5-S1 intervertebral space.  The fascia was incised at the midline.  The paraspinal musculature was bluntly swept laterally.   Anatomic landmarks for the pedicles were exposed. Using fluoroscopy, I did cannulate the L5 and S1 pedicles bilaterally, using a medial to lateral cortical trajectory technique.  On the right side, the posterolateral gutter and right facet joint at L5/S1 was decorticated and 54mm and 66mm screws were placed and a 35-mm rod was placed and distraction was applied across the rod on the right side.  On the left side, the cannulated pedicle holes were filled with bone wax.  I then proceeded with the decompressive aspect of the procedure.  On the left side, I did perform a thorough and complete neuroforaminal decompression, with near-complete removal of the left L5-S1 facet joint. I was able to expose the exiting left L5 nerve. With an assistant holding medial retraction of the traversing left S1 nerve, I did perform an annulotomy at the posterolateral aspect of the L5/S1 intervertebral space.  There was a large osteophyte noted, which was removed using an osteotome. I then used a series of curettes and pituitary rongeurs to perform a thorough and complete intervertebral diskectomy.  The intervertebral space was then liberally packed with autograft as well as allograft in the form of ViviGen, as was the appropriate-sized intervertebral spacer.  The spacer was then tamped into position in the usual fashion, and expanded to 43mm.  I was very pleased with the press-fit of the spacer.  I then placed 73mm and 31mm screws on the left at L5 and S1.  A 35-mm rod was then placed and caps were placed. The distraction was then released on the contralateral right side.  All 4 caps were then locked.  The wound was copiously irrigated with a total of approximately 3 L prior to placing the bone graft. Additional autograft and allograft were then packed into the posterolateral gutter on the right side to help aid in the L5-S1 fusion.  The wound was explored for any undue bleeding and there was no substantial  bleeding encountered.  Gel-Foam was placed over the laminectomy site.  The wound was then closed in layers using #1 Vicryl followed by 2-0 Vicryl, followed by 4-0 Monocryl.  Benzoin and Steri-Strips were applied followed by sterile dressing.  Of note, I did use triggered EMG to test the screws on the left, and there was no screw that tested below 15 mA.  There is no abnormal EMG activity noted throughout the entire surgery.   Of note, Jason Coop was my assistant throughout surgery, and did aid in retraction, suctioning, and closure.       Estill Bamberg, MD

## 2019-09-09 NOTE — Anesthesia Procedure Notes (Signed)
Procedure Name: Intubation Date/Time: 09/09/2019 9:25 AM Performed by: Kathryne Hitch, CRNA Pre-anesthesia Checklist: Patient identified, Emergency Drugs available, Suction available and Patient being monitored Patient Re-evaluated:Patient Re-evaluated prior to induction Oxygen Delivery Method: Circle system utilized Preoxygenation: Pre-oxygenation with 100% oxygen Induction Type: IV induction Ventilation: Mask ventilation without difficulty Laryngoscope Size: Mac and 3 Grade View: Grade II Tube type: Oral Number of attempts: 2 Airway Equipment and Method: Stylet and Oral airway Placement Confirmation: ETT inserted through vocal cords under direct vision,  positive ETCO2 and breath sounds checked- equal and bilateral Secured at: 21 cm Tube secured with: Tape Dental Injury: Teeth and Oropharynx as per pre-operative assessment

## 2019-09-09 NOTE — Anesthesia Postprocedure Evaluation (Signed)
Anesthesia Post Note  Patient: Benjamin Hernandez  Procedure(s) Performed: LEFT-SIDED LUMBAR FIVE- SACRUM ONE TRANSFORAMINAL LUMBAR INTERBODY FUSION WITH INSTRUMENTATION AND ALLOGRAFT (Left )     Patient location during evaluation: PACU Anesthesia Type: General Level of consciousness: awake and alert Pain management: pain level controlled Vital Signs Assessment: post-procedure vital signs reviewed and stable Respiratory status: spontaneous breathing, nonlabored ventilation, respiratory function stable and patient connected to nasal cannula oxygen Cardiovascular status: blood pressure returned to baseline and stable Postop Assessment: no apparent nausea or vomiting Anesthetic complications: no    Last Vitals:  Vitals:   09/09/19 1359 09/09/19 1637  BP: 127/89 130/80  Pulse: 81 81  Resp: 16 19  Temp:  (!) 36.4 C  SpO2: 96% 97%    Last Pain:  Vitals:   09/09/19 1637  TempSrc: Oral  PainSc:                  Benjamin Hernandez

## 2019-09-09 NOTE — H&P (Signed)
PREOPERATIVE H&P  Chief Complaint: Low back pain  HPI: Benjamin Hernandez is a 38 y.o. male who presents with ongoing pain in the back and bilateral legs for over 8 months s/p a work injury.   MRI reveals L5/S1 DDD. Discogram notable for concordant pain at L5/S1.  Patient has failed multiple forms of conservative care and continues to have pain (see office notes for additional details regarding the patient's full course of treatment)  Past Medical History:  Diagnosis Date  . Spontaneous pneumothorax    Past Surgical History:  Procedure Laterality Date  . CHEST TUBE INSERTION    . LUNG SURGERY     Social History   Socioeconomic History  . Marital status: Single    Spouse name: Not on file  . Number of children: Not on file  . Years of education: Not on file  . Highest education level: Not on file  Occupational History  . Not on file  Social Needs  . Financial resource strain: Not on file  . Food insecurity    Worry: Not on file    Inability: Not on file  . Transportation needs    Medical: Not on file    Non-medical: Not on file  Tobacco Use  . Smoking status: Never Smoker  . Smokeless tobacco: Former Engineer, water and Sexual Activity  . Alcohol use: Yes    Comment: social  . Drug use: Never  . Sexual activity: Not on file  Lifestyle  . Physical activity    Days per week: Not on file    Minutes per session: Not on file  . Stress: Not on file  Relationships  . Social Musician on phone: Not on file    Gets together: Not on file    Attends religious service: Not on file    Active member of club or organization: Not on file    Attends meetings of clubs or organizations: Not on file    Relationship status: Not on file  Other Topics Concern  . Not on file  Social History Narrative  . Not on file   History reviewed. No pertinent family history. Allergies  Allergen Reactions  . Adhesive [Tape] Rash    laxtex type adhesives   . Latex Rash    Prior to Admission medications   Medication Sig Start Date End Date Taking? Authorizing Provider  acetaminophen-codeine (TYLENOL #3) 300-30 MG tablet Take 1 tablet by mouth every 6 (six) hours as needed for moderate pain.   Yes [provider]  cetirizine (ZYRTEC) 10 MG tablet Take 10 mg by mouth daily as needed for allergies.   Yes [provider]  cyclobenzaprine (FLEXERIL) 5 MG tablet Take 5 mg by mouth 2 (two) times daily as needed for muscle spasms. 08/11/19  Yes [provider]  methocarbamol (ROBAXIN) 500 MG tablet Take 500 mg by mouth daily as needed for muscle spasms.  08/11/19  Yes [provider]     All other systems have been reviewed and were otherwise negative with the exception of those mentioned in the HPI and as above.  Physical Exam: Vitals:   09/09/19 0649 09/09/19 0659  BP:  (!) 147/97  Pulse: 97   Resp: 20   Temp: 97.8 F (36.6 C)   SpO2: 98%     Body mass index is 29.54 kg/m.  General: Alert, no acute distress Cardiovascular: No pedal edema Respiratory: No cyanosis, no use of accessory musculature  Skin: No lesions in the area of chief complaint Neurologic: Sensation intact distally Psychiatric: Patient is competent for consent with normal mood and affect Lymphatic: No axillary or cervical lymphadenopathy   Assessment/Plan: LOW BACK PAIN, SECONDARY TO LUMBAR 5- SACRUM 1 [place] for Procedure(s): LEFT-SIDED LUMBAR 5- SACRUM 1 TRANSFORAMINAL LUMBAR INTERBODY FUSION WITH INSTRUMENTATION AND ALLOGRAFT   Norva Karvonen, MD 09/09/2019 8:12 AM

## 2019-09-09 NOTE — Anesthesia Preprocedure Evaluation (Signed)
Anesthesia Evaluation  Patient identified by MRN, date of birth, ID band Patient awake    Reviewed: Allergy & Precautions, NPO status , Patient's Chart, lab work & pertinent test results  History of Anesthesia Complications Negative for: history of anesthetic complications  Airway Mallampati: II  TM Distance: >3 FB Neck ROM: Full    Dental  (+) Dental Advisory Given   Pulmonary  09/07/2019 SARS coronavirus NEG   breath sounds clear to auscultation       Cardiovascular negative cardio ROS   Rhythm:Regular Rate:Normal     Neuro/Psych negative neurological ROS     GI/Hepatic negative GI ROS, Neg liver ROS,   Endo/Other  negative endocrine ROS  Renal/GU negative Renal ROS     Musculoskeletal   Abdominal   Peds  Hematology negative hematology ROS (+)   Anesthesia Other Findings   Reproductive/Obstetrics                             Anesthesia Physical Anesthesia Plan  ASA: I  Anesthesia Plan: General   Post-op Pain Management:    Induction: Intravenous  PONV Risk Score and Plan: 2 and Ondansetron and Dexamethasone  Airway Management Planned: Oral ETT  Additional Equipment:   Intra-op Plan:   Post-operative Plan: Extubation in OR  Informed Consent: I have reviewed the patients History and Physical, chart, labs and discussed the procedure including the risks, benefits and alternatives for the proposed anesthesia with the patient or authorized representative who has indicated his/her understanding and acceptance.     Dental advisory given  Plan Discussed with: CRNA and Surgeon  Anesthesia Plan Comments:         Anesthesia Quick Evaluation

## 2019-09-09 NOTE — Evaluation (Signed)
Physical Therapy Evaluation Patient Details Name: Benjamin Hernandez MRN: [id number] DOB: 01/01/1981 Today's Date: 09/09/2019   History of Present Illness  Benjamin Hernandez is a 38 y.o. male who presents with ongoing pain in the back and bilateral legs for over 8 months s/p a work injury.  Now s/p L5-S1 TLIF/PLF.  Clinical Impression  Patient presents with decreased mobility due to pain, limited knowledge of precautions and decreased LE strength/sensation.  Currently mobilizing with minguard to S level assist.  Feel he will benefit from skilled PT in the acute setting to maximize safety and independence at d/c.  Likely no PT follow up initially at d/c.     Follow Up Recommendations No PT follow up    Equipment Recommendations  None recommended by PT(states mother has purchased equipment)    Recommendations for Other Services       Precautions / Restrictions Precautions Precautions: Fall;Back Precaution Booklet Issued: Yes (comment) Required Braces or Orthoses: Spinal Brace Spinal Brace: Thoracolumbosacral orthotic;Applied in sitting position Restrictions Weight Bearing Restrictions: No      Mobility  Bed Mobility Overal bed mobility: Needs Assistance Bed Mobility: Sit to Sidelying         Sit to sidelying: Min guard General bed mobility comments: cues for technique into bed and guided legs up  Transfers Overall transfer level: Needs assistance   Transfers: Sit to/from Stand Sit to Stand: Supervision         General transfer comment: increased time, cues for technique up from and down to EOB  Ambulation/Gait Ambulation/Gait assistance: Min guard Gait Distance (Feet): 200 Feet Assistive device: Rolling walker (2 wheeled) Gait Pattern/deviations: Step-to pattern;Step-through pattern;Decreased stride length;Antalgic;Trunk flexed     General Gait Details: cues throughout to stay in walker and keep hips forward, but flexed at times and heavy UE support on walker to  reduce pressure on his back  Stairs            Wheelchair Mobility    Modified Rankin (Stroke Patients Only)       Balance Overall balance assessment: Needs assistance   Sitting balance-Leahy Scale: Good       Standing balance-Leahy Scale: Fair Standing balance comment: can stand without UE support, but holding wall/door facing when without walker due to pain/balance                             Pertinent Vitals/Pain Pain Assessment: Faces Faces Pain Scale: Hurts whole lot Pain Location: back Pain Descriptors / Indicators: Aching;Grimacing;Moaning Pain Intervention(s): Monitored during session;Repositioned;Patient requesting pain meds-RN notified;Ice applied    Home Living Family/patient expects to be discharged to:: Private residence Living Arrangements: Alone Available Help at Discharge: Family;Available 24 hours/day Type of Home: House Home Access: Stairs to enter   [employer] of Steps: 2 small at mother's home, 3 larger steps at his home Home Layout: One level Home Equipment: [employer] - 2 wheels;Cane - single point;Grab bars - tub/shower;Shower seat Additional Comments: reports his mom has bought him walker, cane, lift chair, adjustable bed and shower chair    Prior Function Level of Independence: Independent         Comments: having difficulty due to LE numbness/weakness reports feel/severe ankle sprain 3-4 months ago due to numbness     Hand Dominance        Extremity/Trunk Assessment   Upper Extremity Assessment Upper Extremity Assessment: LUE deficits/detail LUE Deficits / Details: reports L UE numbness/tingling/aching worse since  surgery or maybe since testing for surgery?    Lower Extremity Assessment Lower Extremity Assessment: LLE deficits/detail;RLE deficits/detail RLE Deficits / Details: AROM grossly WFL, subjective report of weakness and numbness worse below the knees but also on thighs RLE Sensation: decreased  light touch LLE Deficits / Details: AROM grossly WFL, subjective report of weakness and numbness worse below the knees but also on thighs LLE Sensation: decreased light touch    Cervical / Trunk Assessment Cervical / Trunk Assessment: Other exceptions Cervical / Trunk Exceptions: post surgical  Communication   Communication: No difficulties  Cognition Arousal/Alertness: Awake/alert Behavior During Therapy: WFL for tasks assessed/performed Overall Cognitive Status: Within Functional Limits for tasks assessed                                        General Comments      Exercises     Assessment/Plan    PT Assessment Patient needs continued PT services  PT Problem List Decreased strength;Decreased knowledge of use of DME;Decreased mobility;Decreased balance;Decreased knowledge of precautions       PT Treatment Interventions DME instruction;Stair training;Therapeutic activities;Functional mobility training;Gait training;Patient/family education    PT Goals (Current goals can be found in the Care Plan section)  Acute Rehab PT Goals Patient Stated Goal: to reduce pain PT Goal Formulation: With patient Time For Goal Achievement: 09/11/19 Potential to Achieve Goals: Good    Frequency Min 5X/week   Barriers to discharge        Co-evaluation               AM-PAC PT "6 Clicks" Mobility  Outcome Measure Help needed turning from your back to your side while in a flat bed without using bedrails?: A Little Help needed moving from lying on your back to sitting on the side of a flat bed without using bedrails?: A Little Help needed moving to and from a bed to a chair (including a wheelchair)?: A Little Help needed standing up from a chair using your arms (e.g., wheelchair or bedside chair)?: A Little Help needed to walk in hospital room?: A Little Help needed climbing 3-5 steps with a railing? : A Little 6 Click Score: 18    End of Session Equipment  Utilized During Treatment: Back brace Activity Tolerance: Patient limited by pain Patient left: in bed;with call bell/phone within reach Nurse Communication: Mobility status PT Visit Diagnosis: Difficulty in walking, not elsewhere classified (R26.2)    Time: 8563-1497 PT Time Calculation (min) (ACUTE ONLY): 24 min   Charges:   PT Evaluation $PT Eval Low Complexity: 1 Low PT Treatments $Gait Training: 8-22 mins        Sheran Lawless, PT Acute Rehabilitation Services [phone] 09/09/2019   Elray Mcgregor 09/09/2019, 5:27 PM

## 2019-09-09 NOTE — Progress Notes (Signed)
Orthopedic Tech Progress Note Patient Details:  Benjamin Hernandez February 22, 1981 [id number] RN said patient has brace Patient ID: [name], male   DOB: February 22, 1981, 38 y.o.   MRN: [id number]   Janit Pagan 09/09/2019, 3:19 PM

## 2019-09-10 MED ORDER — HALOPERIDOL 5 MG PO TABS
5.0000 mg | ORAL_TABLET | Freq: Four times a day (QID) | ORAL | Status: DC | PRN
  Administered 2019-09-10 – 2019-09-11 (×2): 5 mg via ORAL
  Filled 2019-09-10 (×6): qty 1

## 2019-09-10 MED ORDER — HALOPERIDOL LACTATE 5 MG/ML IJ SOLN
5.0000 mg | Freq: Four times a day (QID) | INTRAMUSCULAR | Status: DC | PRN
  Filled 2019-09-10 (×4): qty 1

## 2019-09-10 MED ORDER — DIAZEPAM 5 MG PO TABS: 5.0000 mg | ORAL_TABLET | Freq: Four times a day (QID) | ORAL | 0 refills | Status: DC | PRN

## 2019-09-10 MED ORDER — OXYCODONE-ACETAMINOPHEN 5-325 MG PO TABS: 1.0000 | ORAL_TABLET | ORAL | 0 refills | Status: DC | PRN

## 2019-09-10 NOTE — Evaluation (Signed)
Occupational Therapy Evaluation Patient Details Name: Benjamin Hernandez MRN: [id number] DOB: [birth year] Today's Date: 09/10/2019    History of Present Illness DARREN CALDRON is a 38 y.o. male who presents with ongoing pain in the back and bilateral legs for over 8 months s/p a work injury.  Now s/p L5-S1 TLIF/PLF.   Clinical Impression   OT eval and education complete.  Pt argued with OT about back precautions stating we all told him something diffferent.  Educated pt with hand out and verbally and by end of OT session pt did verbalize understanding    Follow Up Recommendations  Supervision/Assistance - 24 hour    Equipment Recommendations  None recommended by OT    Recommendations for Other Services       Precautions / Restrictions Precautions Precautions: Knee;Back Required Braces or Orthoses: Spinal Brace Spinal Brace: Thoracolumbosacral orthotic;Applied in sitting position      Mobility Bed Mobility                  Transfers Overall transfer level: Needs assistance Equipment used: Rolling walker (2 wheeled) Transfers: Sit to/from [employer] Sit to Stand: Supervision Stand pivot transfers: Supervision       General transfer comment: increased time, cues for technique up from and down to EOB    Balance Overall balance assessment: Needs assistance   Sitting balance-Leahy Scale: Good       Standing balance-Leahy Scale: Fair Standing balance comment: can stand without UE support, but holding wall/door facing when without walker due to pain/balance                           ADL either performed or assessed with clinical judgement   ADL Overall ADL's : Needs assistance/impaired Eating/Feeding: Sitting;Independent   Grooming: Set up;Wash/dry face;Standing   Upper Body Bathing: Set up;Sitting   Lower Body Bathing: Minimal assistance;Sit to/from stand;Cueing for compensatory techniques;Cueing for safety   Upper Body  Dressing : Set up;Sitting   Lower Body Dressing: Minimal assistance;Sit to/from stand;Cueing for compensatory techniques;Cueing for safety   Toilet Transfer: Min guard;Ambulation;Comfort height toilet   Toileting- Clothing Manipulation and Hygiene: Minimal assistance;Sit to/from stand         General ADL Comments: Educated pt on ADL activity regarding ADL activity and back precautions.  Pt argued with OT regarding back precautions and stated that is not what MD told him.  OT reinterated no bending, twisting, lifting or arching.  Pt stated these were different and we all told him different things.  At end of OT session verbalized understanding of back precautions     Vision Patient Visual Report: No change from baseline              Pertinent Vitals/Pain Pain Assessment: Faces Pain Score: 4  Pain Location: back Pain Descriptors / Indicators: Aching;Grimacing     Hand Dominance     Extremity/Trunk Assessment Upper Extremity Assessment Upper Extremity Assessment: Generalized weakness LUE Deficits / Details: pt stated LUE better this day. No tingling           Communication Communication Communication: No difficulties   Cognition Arousal/Alertness: Awake/alert Behavior During Therapy: WFL for tasks assessed/performed Overall Cognitive Status: Within Functional Limits for tasks assessed  Home Living Family/patient expects to be discharged to:: Private residence Living Arrangements: Alone Available Help at Discharge: Family;Available 24 hours/day Type of Home: House Home Access: Stairs to enter [employer] of Steps: 2 small at mother's home, 3 larger steps at his home   Home Layout: One level     Bathroom Shower/Tub: Teacher, early years/pre: Standard     Home Equipment: Environmental consultant - 2 wheels;Cane - single point;Grab bars - tub/shower;Shower seat   Additional Comments: reports  his mom has bought him walker, cane, lift chair, adjustable bed and shower chair      Prior Functioning/Environment Level of Independence: Independent        Comments: having difficulty due to LE numbness/weakness reports feel/severe ankle sprain 3-4 months ago due to numbness                 OT Goals(Current goals can be found in the care plan section) Acute Rehab OT Goals Patient Stated Goal: to reduce pain OT Goal Formulation: With patient Potential to Achieve Goals: Good  OT Frequency:      AM-PAC OT "6 Clicks" Daily Activity     Outcome Measure Help from another person eating meals?: None Help from another person taking care of personal grooming?: None Help from another person toileting, which includes using toliet, bedpan, or urinal?: A Little Help from another person bathing (including washing, rinsing, drying)?: A Little Help from another person to put on and taking off regular upper body clothing?: None Help from another person to put on and taking off regular lower body clothing?: A Little 6 Click Score: 21   End of Session Equipment Utilized During Treatment: Rolling walker Nurse Communication: Mobility status  Activity Tolerance: Patient tolerated treatment well Patient left: in chair                   Time: 4854-6270 OT Time Calculation (min): 19 min Charges:  OT General Charges $OT Visit: 1 Visit OT Evaluation $OT Eval Low Complexity: 1 Low  Kari Baars, OT Acute Rehabilitation Services [phone] Office- [place], Edwena Felty D 09/10/2019, 1:05 PM

## 2019-09-10 NOTE — Progress Notes (Signed)
I spoke with [name], FNP, regarding MR. Benjamin Hernandez. She is recommending admission to admission to psychiatry. As such, he will be discharged to psychiatry service for workup and treatment as deemed appropriate by psychiatry. From the standpoint of the patient's surgery, he is doing well and will be reevaluated in 2 weeks. He was reminded of his restrictions this morning.
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
## 2019-09-10 NOTE — Progress Notes (Signed)
Physical Therapy Treatment Patient Details Name: Benjamin Hernandez MRN: [id number] DOB: [birth year] Today's Date: 09/10/2019    History of Present Illness GRAY DOERING is a 38 y.o. male who presents with ongoing pain in the back and bilateral legs for over 8 months s/p a work injury.  Now s/p L5-S1 TLIF/PLF.    PT Comments    Pt progressing well with post-op mobility. He was able to demonstrate transfers and ambulation with gross min guard assist to supervision for safety. Pt reports to this therapist that he does not understand why he has "someone watching" him (the Recruitment consultant) and why he had his belongings removed from the room (suicide precautions). Pt reports he believes that his sister told a lie to the nurse which prompted these precautions and appeared upset regarding this.   Pt was educated on precautions, brace application/wearing schedule, appropriate activity progression, and car transfer. Will continue to follow.       Follow Up Recommendations  No PT follow up     Equipment Recommendations  None recommended by PT(states mother has purchased equipment)    Recommendations for Other Services       Precautions / Restrictions Precautions Precautions: Knee;Back Precaution Booklet Issued: Yes (comment) Precaution Comments: Reviewed precautions verbally throughout functional mobility. Pt currently on suicide precautions with Recruitment consultant. Required Braces or Orthoses: Spinal Brace Spinal Brace: Thoracolumbosacral orthotic;Applied in sitting position Restrictions Weight Bearing Restrictions: No    Mobility  Bed Mobility Overal bed mobility: Needs Assistance Bed Mobility: Rolling;Sidelying to Sit Rolling: Supervision Sidelying to sit: Supervision       General bed mobility comments: VC's for proper log roll technique. Pt was able to complete without assistance.   Transfers Overall transfer level: Needs assistance Equipment used: Rolling walker (2  wheeled) Transfers: Sit to/from [employer] Sit to Stand: Supervision Stand pivot transfers: Supervision       General transfer comment: increased time, cues for technique up from and down to EOB  Ambulation/Gait Ambulation/Gait assistance: Min guard;Supervision Gait Distance (Feet): 250 Feet Assistive device: Rolling walker (2 wheeled) Gait Pattern/deviations: Step-to pattern;Step-through pattern;Decreased stride length;Antalgic;Trunk flexed Gait velocity: Decreased Gait velocity interpretation: <1.8 ft/sec, indicate of risk for recurrent falls General Gait Details: VC's for improved posture, closer walker proximity, and to decrease weight through arms (elbows locked with shoulders elevated).    Stairs             Wheelchair Mobility    Modified Rankin (Stroke Patients Only)       Balance Overall balance assessment: Needs assistance Sitting-balance support: No upper extremity supported Sitting balance-Leahy Scale: Good       Standing balance-Leahy Scale: Fair Standing balance comment: can stand without UE support, but holding wall/door facing when without walker due to pain/balance                            Cognition Arousal/Alertness: Awake/alert Behavior During Therapy: WFL for tasks assessed/performed Overall Cognitive Status: Within Functional Limits for tasks assessed                                        Exercises      General Comments        Pertinent Vitals/Pain Pain Assessment: Faces Pain Score: 4  Faces Pain Scale: Hurts whole lot Pain Location: back Pain Descriptors / Indicators: Aching;Grimacing  Pain Intervention(s): Monitored during session;Repositioned    Home Living Family/patient expects to be discharged to:: Private residence Living Arrangements: Alone Available Help at Discharge: Family;Available 24 hours/day Type of Home: House Home Access: Stairs to enter   Home Layout: One  level Home Equipment: Environmental consultant - 2 wheels;Cane - single point;Grab bars - tub/shower;Shower seat Additional Comments: reports his mom has bought him walker, cane, lift chair, adjustable bed and shower chair    Prior Function Level of Independence: Independent      Comments: having difficulty due to LE numbness/weakness reports feel/severe ankle sprain 3-4 months ago due to numbness   PT Goals (current goals can now be found in the care plan section) Acute Rehab PT Goals Patient Stated Goal: to reduce pain PT Goal Formulation: With patient Time For Goal Achievement: 09/11/19 Potential to Achieve Goals: Good Progress towards PT goals: Progressing toward goals    Frequency    Min 5X/week      PT Plan Current plan remains appropriate    Co-evaluation              AM-PAC PT "6 Clicks" Mobility   Outcome Measure  Help needed turning from your back to your side while in a flat bed without using bedrails?: A Little Help needed moving from lying on your back to sitting on the side of a flat bed without using bedrails?: A Little Help needed moving to and from a bed to a chair (including a wheelchair)?: A Little Help needed standing up from a chair using your arms (e.g., wheelchair or bedside chair)?: A Little Help needed to walk in hospital room?: A Little Help needed climbing 3-5 steps with a railing? : A Little 6 Click Score: 18    End of Session Equipment Utilized During Treatment: Back brace Activity Tolerance: Patient limited by pain Patient left: in bed;with call bell/phone within reach Nurse Communication: Mobility status PT Visit Diagnosis: Difficulty in walking, not elsewhere classified (R26.2)     Time: 9373-4287 PT Time Calculation (min) (ACUTE ONLY): 35 min  Charges:  $Gait Training: 23-37 mins                     Rolinda Roan, PT, DPT Acute Rehabilitation Services Pager: [phone] Office: [phone]    Thelma Comp 09/10/2019, 1:30 PM

## 2019-09-10 NOTE — Progress Notes (Signed)
    Patient doing well from standpoint of surgery He's very frustrated about having a sitter and that it is documented that he is suicidal He tells me that he's not suicidal and does not wish to hurt himself or others   Physical Exam: Vitals:   09/10/19 0345 09/10/19 0731  BP: 119/85 119/88  Pulse: (!) 101 (!) 103  Resp: 16 16  Temp: 97.8 F (36.6 C) 97.6 F (36.4 C)  SpO2: 97% 100%    Dressing in place NVI  POD #1 s/p L5/S1 decompression and fusion, doing well from standpoint of surgery  - up with PT/OT, encourage ambulation - Percocet for pain, Valium for muscle spasms - may d/c home today with f/u in 2 weeks, however, we do need await input from psychiatry. I did express to him the reasoning behind this.

## 2019-09-10 NOTE — Progress Notes (Signed)
Discharge planning in progress; Whitman Hero RN CM / Cedric Fishman LCSW working with case for IVC/ Psych. Mindi Slicker [facility] Advanced Care Supervisor

## 2019-09-10 NOTE — Progress Notes (Addendum)
2pm-CSW placed completed IVC packet on unit chart and contacted [employer] to serve patient. Awaiting call back from [facility].   1:20pm-CSW faxed IVC to magistrate and confirmed receipt; they are faxing custody order back. CSW left voicemail for [facility] Disposition for bed availability.   Percell Locus Marikay Roads LCSW [phone]

## 2019-09-11 ENCOUNTER — Other Ambulatory Visit: Payer: Self-pay

## 2019-09-11 ENCOUNTER — Inpatient Hospital Stay
Admission: AD | Admit: 2019-09-11 | Discharge: 2019-09-11 | DRG: 881 | Disposition: A | Discharge status: Home / Self Care | Payer: Self-pay | Attending: Psychiatry | Admitting: Psychiatry

## 2019-09-11 ENCOUNTER — Encounter: Payer: Self-pay | Admitting: Psychiatry

## 2019-09-11 DIAGNOSIS — M549 Dorsalgia, unspecified: Secondary | ICD-10-CM

## 2019-09-11 DIAGNOSIS — F4321 Adjustment disorder with depressed mood: Principal | ICD-10-CM | POA: Diagnosis present

## 2019-09-11 DIAGNOSIS — Z91040 Latex allergy status: Secondary | ICD-10-CM

## 2019-09-11 DIAGNOSIS — Z91048 Other nonmedicinal substance allergy status: Secondary | ICD-10-CM | POA: Diagnosis not present

## 2019-09-11 DIAGNOSIS — M5410 Radiculopathy, site unspecified: Secondary | ICD-10-CM | POA: Diagnosis present

## 2019-09-11 HISTORY — DX: Dorsalgia, unspecified: M54.9

## 2019-09-11 HISTORY — DX: Adjustment disorder with depressed mood: F43.21

## 2019-09-11 LAB — BASIC METABOLIC PANEL
Anion gap: 9 (ref 5–15)
BUN: 13 mg/dL (ref 6–20)
CO2: 27 mmol/L (ref 22–32)
Calcium: 8.7 mg/dL — ABNORMAL LOW (ref 8.9–10.3)
Chloride: 99 mmol/L (ref 98–111)
Creatinine, Ser: 1.11 mg/dL (ref 0.61–1.24)
GFR calc Af Amer: >60 mL/min (ref >60)
GFR calc non Af Amer: >60 mL/min (ref >60)
Glucose, Bld: 104 mg/dL — ABNORMAL HIGH (ref 70–99)
Potassium: 3.9 mmol/L (ref 3.5–5.1)
Sodium: 135 mmol/L (ref 135–145)

## 2019-09-11 MED ORDER — OXYCODONE-ACETAMINOPHEN 5-325 MG PO TABS
1.5000 | ORAL_TABLET | ORAL | Status: DC | PRN
  Administered 2019-09-11: 1.5 via ORAL
  Filled 2019-09-11: qty 2

## 2019-09-11 MED ORDER — ALUM & MAG HYDROXIDE-SIMETH 200-200-20 MG/5ML PO SUSP: 30.0000 mL | ORAL | Status: DC | PRN

## 2019-09-11 MED ORDER — HYDROXYZINE HCL 50 MG PO TABS: 50.0000 mg | ORAL_TABLET | Freq: Three times a day (TID) | ORAL | Status: DC | PRN

## 2019-09-11 MED ORDER — OXYCODONE-ACETAMINOPHEN 7.5-325 MG PO TABS
1.0000 | ORAL_TABLET | ORAL | Status: DC | PRN
  Filled 2019-09-11 (×2): qty 1

## 2019-09-11 MED ORDER — TRAZODONE HCL 100 MG PO TABS: 100.0000 mg | ORAL_TABLET | Freq: Every evening | ORAL | 0 refills | Status: DC | PRN

## 2019-09-11 MED ORDER — HALOPERIDOL LACTATE 5 MG/ML IJ SOLN
5.0000 mg | Freq: Four times a day (QID) | INTRAMUSCULAR | Status: DC | PRN
  Filled 2019-09-11: qty 1

## 2019-09-11 MED ORDER — OXYCODONE-ACETAMINOPHEN 5-325 MG PO TABS: 1.0000 | ORAL_TABLET | ORAL | 0 refills | Status: DC | PRN

## 2019-09-11 MED ORDER — MAGNESIUM HYDROXIDE 400 MG/5ML PO SUSP: 30.0000 mL | Freq: Every day | ORAL | Status: DC | PRN

## 2019-09-11 MED ORDER — TRAZODONE HCL 100 MG PO TABS: 100.0000 mg | ORAL_TABLET | Freq: Every evening | ORAL | Status: DC | PRN

## 2019-09-11 MED ORDER — HALOPERIDOL 5 MG PO TABS
5.0000 mg | ORAL_TABLET | Freq: Four times a day (QID) | ORAL | Status: DC | PRN
  Filled 2019-09-11: qty 1

## 2019-09-11 NOTE — Discharge Summary (Signed)
Physician Discharge Summary Note  Patient:  Benjamin Hernandez is an 38 y.o., male MRN:  [id number] DOB:  [birth year] Patient phone:  [phone] (home)  Patient address:   [address] [place] [address],  Total Time spent with patient: 30 minutes  Date of Admission:  09/11/2019 Date of Discharge: September 11, 2019  Reason for Admission: Patient was transferred to [place] from [facility] because of concern about suicidal ideation  Principal Problem: Adjustment disorder with depressed mood Discharge Diagnoses: Principal Problem:   Adjustment disorder with depressed mood Active Problems:   Radiculopathy   Back pain   Past Psychiatric History: Patient has no past psychiatric history.  No prior hospitalizations no medications no history of suicide attempts  Past Medical History:  Past Medical History:  Diagnosis Date  . Spontaneous pneumothorax     Past Surgical History:  Procedure Laterality Date  . CHEST TUBE INSERTION    . LUNG SURGERY     Family History: History reviewed. No pertinent family history. Family Psychiatric  History: None reported Social History:  Social History   Substance and Sexual Activity  Alcohol Use Yes   Comment: social     Social History   Substance and Sexual Activity  Drug Use Never    Social History   Socioeconomic History  . Marital status: Single    Spouse name: Not on file  . Number of children: Not on file  . Years of education: Not on file  . Highest education level: Not on file  Occupational History  . Not on file  Social Needs  . Financial resource strain: Not on file  . Food insecurity    Worry: Not on file    Inability: Not on file  . Transportation needs    Medical: Not on file    Non-medical: Not on file  Tobacco Use  . Smoking status: Never Smoker  . Smokeless tobacco: Former Network engineer and Sexual Activity  . Alcohol use: Yes    Comment: social  . Drug use: Never  . Sexual activity: Not on file  Lifestyle   . Physical activity    Days per week: Not on file    Minutes per session: Not on file  . Stress: Not on file  Relationships  . Social Herbalist on phone: Not on file    Gets together: Not on file    Attends religious service: Not on file    Active member of club or organization: Not on file    Attends meetings of clubs or organizations: Not on file    Relationship status: Not on file  Other Topics Concern  . Not on file  Social History Narrative  . Not on file    Hospital Course: Patient was admitted to the psychiatric unit.  15-minute checks maintained.  Patient was very clear from first evaluation that he was not suicidal.  On my evaluation it appeared that he had no symptoms of depression.  I know it is charted that he had made suicidal statements 2 days ago but the patient continues to deny having done that.  I spoke with his mother who said she felt certain that he was not suicidal.  She had never had any suspicion of his being depressed or suicidal never heard anything about it from him.  In addition he will be living with her when he first goes home.  Patient is motivated for taking care of himself and looking forward to healing  up.  There is no rationale that I can see for continuing to keep him in the hospital.  Physical therapy saw him briefly for evaluation and now we will discharge him home.  He needs to follow-up with the surgeon in 2 weeks.  Physical Findings: AIMS:  , ,  ,  ,    CIWA:    COWS:     Musculoskeletal: Strength & Muscle Tone: within normal limits Gait & Station: unsteady Patient leans: N/A  Psychiatric Specialty Exam: Physical Exam  Nursing note and vitals reviewed. Constitutional: He appears well-developed and well-nourished.  HENT:  Head: Normocephalic and atraumatic.  Eyes: Pupils are equal, round, and reactive to light. Conjunctivae are normal.  Neck: Normal range of motion.  Cardiovascular: Regular rhythm and normal heart sounds.   Respiratory: Effort normal. No respiratory distress.  GI: Soft.  Musculoskeletal: Normal range of motion.  Neurological: He is alert.  Skin: Skin is warm and dry.  Psychiatric: He has a normal mood and affect. His behavior is normal. Judgment and thought content normal.    Review of Systems  Constitutional: Negative.   HENT: Negative.   Eyes: Negative.   Respiratory: Negative.   Cardiovascular: Negative.   Gastrointestinal: Negative.   Musculoskeletal: Positive for back pain.  Skin: Negative.   Neurological: Negative.   Psychiatric/Behavioral: Negative.     Blood pressure 133/70, pulse (!) 104, temperature 98.4 F (36.9 C), temperature source Oral, resp. rate 18, height 6\' 1"  (1.854 m), weight 98.4 kg, SpO2 97 %.Body mass index is 28.63 kg/m.  General Appearance: Casual  Eye Contact:  Good  Speech:  Clear and Coherent  Volume:  Normal  Mood:  Euthymic  Affect:  Congruent  Thought Process:  Coherent  Orientation:  Full (Time, Place, and Person)  Thought Content:  Logical  Suicidal Thoughts:  No  Homicidal Thoughts:  No  Memory:  Immediate;   Fair Recent;   Fair Remote;   Fair  Judgement:  Fair  Insight:  Fair  Psychomotor Activity:  Normal  Concentration:  Concentration: Fair  Recall:  of Knowledge:  Fair  Language:  Fair  Akathisia:  No  Handed:  Right  AIMS (if indicated):     Assets:  Desire for Improvement  ADL's:  Intact  Cognition:  WNL  Sleep:        Have you used any form of tobacco in the last 30 days? (Cigarettes, Smokeless Tobacco, Cigars, and/or Pipes): No  Has this patient used any form of tobacco in the last 30 days? (Cigarettes, Smokeless Tobacco, Cigars, and/or Pipes) Yes, No  Blood Alcohol level:  No results found for: [facility]  Metabolic Disorder Labs:  No results found for: HGBA1C, MPG No results found for: PROLACTIN No results found for: CHOL, TRIG, HDL, CHOLHDL, VLDL, LDLCALC  See Psychiatric Specialty Exam and Suicide Risk  Assessment completed by Attending Physician prior to discharge.  Discharge destination:  Home  Is patient on multiple antipsychotic therapies at discharge:  No   Has Patient had three or more failed trials of antipsychotic monotherapy by history:  No  Recommended Plan for Multiple Antipsychotic Therapies: NA  Discharge Instructions    Diet - low sodium heart healthy   Complete by: As directed    Increase activity slowly   Complete by: As directed      Allergies as of 09/11/2019      Reactions   Adhesive [tape] Rash   laxtex type adhesives   Latex Rash  Medication List    STOP taking these medications   cetirizine 10 MG tablet Commonly known as: ZYRTEC   diazepam 5 MG tablet Commonly known as: VALIUM     TAKE these medications     Indication  oxyCODONE-acetaminophen 5-325 MG tablet Commonly known as: PERCOCET/ROXICET Take 1-2 tablets by mouth every 4 (four) hours as needed for moderate pain or severe pain.  Indication: Pain   traZODone 100 MG tablet Commonly known as: DESYREL Take 1 tablet (100 mg total) by mouth at bedtime as needed for sleep.  Indication: Trouble Sleeping        Follow-up recommendations:  Activity:  Activity only as tolerated with gradual improvement in motivation and improvement in physical activity as he heals up Diet:  Regular diet Other:  No indication for any psychiatric follow-up at this point.  Pain medication provided at discharge that should last him 2 weeks that he should follow-up then with the surgeon  Comments: Patient and family agreeable to plan.  Signed: Mordecai RasmussenJohn Senaya Dicenso, MD 09/11/2019, 4:10 PM

## 2019-09-11 NOTE — H&P (Signed)
Psychiatric Admission Assessment Adult  Patient Identification: Benjamin Hernandez MRN:  [id number] Date of Evaluation:  09/11/2019 Chief Complaint:  MDD Principal Diagnosis: Adjustment disorder with depressed mood Diagnosis:  Principal Problem:   Adjustment disorder with depressed mood Active Problems:   Radiculopathy   Back pain  History of Present Illness: Patient seen and chart reviewed.  Also spoke with patient's mother by telephone.  Patient was admitted to the hospital in [place] 2 days ago for scheduled back surgery.  1 of the nurses working with him documented that he had made statements to her about having "played [place] roulette" recently and made statements about suicidality.  A tele-psych consult was performed the following day.  Documentation there repeats the original concern but also documents the patient saying that he does not want to kill himself.  In my interview with the patient he absolutely denies any suicidal thoughts.  He says that 2 weeks ago he had a nightmare in which he was playing [place].  He denies having ever actually done it.  He denies having any conscious thought of wanting to die or wanting to kill himself.  He says his mood recently has been a little frustrated because of his back pain but not depressed.  Limited in some of his movement but sleeping adequately.  Feeling optimistic about the future.  No reported psychotic symptoms at all.  He denies that he had been drinking or using any drugs.  I spoke with the patient's mother and she confirmed all of his story.  She tells me she had never heard him make any suicidal comments had never seen him in a condition that suggested that he was suicidal and that suicidality would be very unlike his normal behavior.  Evidently the patient and his mother both tried to argue with the treatment team in [place] today not to admit him to the hospital but commitment papers were filed. Associated  Signs/Symptoms: Depression Symptoms:  None reported (Hypo) Manic Symptoms:  None reported Anxiety Symptoms:  None reported Psychotic Symptoms:  None reported PTSD Symptoms: Negative Total Time spent with patient: 1 hour  Past Psychiatric History: Patient denies any past psychiatric history.  No previous hospitalizations.  No previous prescription of any psychiatric medicine.  Never seen a psychiatrist or mental health provider in the past.  Going through his old chart I do not see anything referenced about anyone being concerned about psychiatric illness.  Is the patient at risk to self? No.  Has the patient been a risk to self in the past 6 months? No.  Has the patient been a risk to self within the distant past? No.  Is the patient a risk to others? No.  Has the patient been a risk to others in the past 6 months? No.  Has the patient been a risk to others within the distant past? No.   Prior Inpatient Therapy:   Prior Outpatient Therapy:    Alcohol Screening:   Substance Abuse History in the last 12 months:  No. Consequences of Substance Abuse: Negative Previous Psychotropic Medications: No  Psychological Evaluations: No  Past Medical History:  Past Medical History:  Diagnosis Date  . Spontaneous pneumothorax     Past Surgical History:  Procedure Laterality Date  . CHEST TUBE INSERTION    . LUNG SURGERY     Family History: No family history on file. Family Psychiatric  History: Denies any Tobacco Screening:   Social History:  Social History   Substance and Sexual  Activity  Alcohol Use Yes   Comment: social     Social History   Substance and Sexual Activity  Drug Use Never    Additional Social History:                           Allergies:   Allergies  Allergen Reactions  . Adhesive [Tape] Rash    laxtex type adhesives   . Latex Rash   Lab Results: No results found for this or any previous visit (from the past 48 hour(s)).  Blood Alcohol  level:  No results found for: [facility]  Metabolic Disorder Labs:  No results found for: HGBA1C, MPG No results found for: PROLACTIN No results found for: CHOL, TRIG, HDL, CHOLHDL, VLDL, LDLCALC  Current Medications: Current Facility-Administered Medications  Medication Dose Route Frequency Provider Last Rate Last Dose  . alum & mag hydroxide-simeth (MAALOX/MYLANTA) 200-200-20 MG/5ML suspension 30 mL  30 mL Oral Q4H PRN Ortencia Askari T, MD      . hydrOXYzine (ATARAX/VISTARIL) tablet 50 mg  50 mg Oral TID PRN Dulcey Riederer T, MD      . magnesium hydroxide (MILK OF MAGNESIA) suspension 30 mL  30 mL Oral Daily PRN Shaundrea Carrigg T, MD      . oxyCODONE-acetaminophen (PERCOCET) 7.5-325 MG per tablet 1 tablet  1 tablet Oral Q4H PRN Kaityln Kallstrom T, MD      . traZODone (DESYREL) tablet 100 mg  100 mg Oral QHS PRN Dai Mcadams, Madie Reno, MD       PTA Medications: Medications Prior to Admission  Medication Sig Dispense Refill Last Dose  . cetirizine (ZYRTEC) 10 MG tablet Take 10 mg by mouth daily as needed for allergies.     . diazepam (VALIUM) 5 MG tablet Take 1 tablet (5 mg total) by mouth every 6 (six) hours as needed for muscle spasms. 30 tablet 0   . oxyCODONE-acetaminophen (PERCOCET/ROXICET) 5-325 MG tablet Take 1-2 tablets by mouth every 4 (four) hours as needed for moderate pain or severe pain. 40 tablet 0     Musculoskeletal: Strength & Muscle Tone: Patient is 2 days post back surgery.  His overall tone is normal but he is having difficulty moving because of pain Gait & Station: unsteady Patient leans: N/A  Psychiatric Specialty Exam: Physical Exam  Nursing note and vitals reviewed. Constitutional: He appears well-developed and well-nourished.  HENT:  Head: Normocephalic and atraumatic.  Eyes: Pupils are equal, round, and reactive to light. Conjunctivae are normal.  Neck: Normal range of motion.  Cardiovascular: Regular rhythm and normal heart sounds.  Respiratory: Effort normal. No  respiratory distress.  GI: Soft.  Musculoskeletal:     Comments: Patient is 2 days post back surgery and has significant pain and limitation in his movement  Neurological: He is alert.  Skin: Skin is warm and dry.  Psychiatric: His speech is normal and behavior is normal. Judgment and thought content normal. His mood appears anxious. Cognition and memory are normal.    Review of Systems  Constitutional: Negative.   HENT: Negative.   Eyes: Negative.   Respiratory: Negative.   Cardiovascular: Negative.   Gastrointestinal: Negative.   Musculoskeletal: Positive for back pain.  Skin: Negative.   Neurological: Negative.   Psychiatric/Behavioral: Negative for depression, hallucinations, memory loss, substance abuse and suicidal ideas. The patient is nervous/anxious. The patient does not have insomnia.     Blood pressure 133/70, pulse (!) 104, temperature 98.4 F (36.9 C), temperature  source Oral, resp. rate 18, height 6\' 1"  (1.854 m), weight 98.4 kg, SpO2 97 %.Body mass index is 28.63 kg/m.  General Appearance: Casual  Eye Contact:  Good  Speech:  Normal Rate  Volume:  Normal  Mood:  Euthymic  Affect:  Congruent  Thought Process:  Goal Directed  Orientation:  Full (Time, Place, and Person)  Thought Content:  Logical  Suicidal Thoughts:  No  Homicidal Thoughts:  No  Memory:  Immediate;   Fair Recent;   Fair Remote;   Fair  Judgement:  Fair  Insight:  Fair  Psychomotor Activity:  Normal  Concentration:  Concentration: Fair  Recall:  [employer]  Fund of Knowledge:  Fair  Language:  Fair  Akathisia:  No  Handed:  Right  AIMS (if indicated):     Assets:  Desire for Improvement Housing Physical Health Resilience Social Support  ADL's:  Impaired  Cognition:  WNL  Sleep:       Treatment Plan Summary: Medication management and Plan I have gone ahead and prescribed the patient oxycodone as needed which [place]  Otherwise it does not  appear that he was on any medical or psychiatric medicine.  Right now I do not see any indication to start any psychiatric medication.  After talking with his mother who is very clearly also in favor of discharge it is likely that we will be discharging him today.  If possible I would very much like to have physical therapy see him first.  I will discuss this with the current treatment team.  Observation Level/Precautions:  15 minute checks  Laboratory:  Chemistry Profile  Psychotherapy:    Medications:    Consultations:    Discharge Concerns:    Estimated LOS:  Other:     Physician Treatment Plan for Primary Diagnosis: Adjustment disorder with depressed mood Long Term Goal(s): Improvement in symptoms so as ready for discharge  Short Term Goals: Ability to demonstrate self-control will improve  Physician Treatment Plan for Secondary Diagnosis: Principal Problem:   Adjustment disorder with depressed mood Active Problems:   Radiculopathy   Back pain  Long Term Goal(s): Improvement in symptoms so as ready for discharge  Short Term Goals: Ability to identify and develop effective coping behaviors will improve  I certify that inpatient services furnished can reasonably be expected to improve the patient's condition.    Mordecai RasmussenJohn Jeraldin Fesler, MD 10/9/20201:31 PM

## 2019-09-11 NOTE — Progress Notes (Signed)
Report given to [facility]. Awaiting for transport.

## 2019-09-11 NOTE — BHH Suicide Risk Assessment (Signed)
BHH INPATIENT:  Family/Significant Other Suicide Prevention Education  Suicide Prevention Education:  Education Completed; Shanda Bumps, mother, [phone] has been identified by the patient as the family member/significant other with whom the patient will be residing, and identified as the person(s) who will aid the patient in the event of a mental health crisis (suicidal ideations/suicide attempt).  With written consent from the patient, the family member/significant other has been provided the following suicide prevention education, prior to the and/or following the discharge of the patient.  The suicide prevention education provided includes the following:  Suicide risk factors  Suicide prevention and interventions  National Suicide Hotline telephone number  [facility] assessment telephone number  [facility] Emergency Assistance [place] and/or Residential Mobile Crisis Unit telephone number  Request made of family/significant other to:  Remove weapons (e.g., guns, rifles, knives), all items previously/currently identified as safety concern.    Remove drugs/medications (over-the-counter, prescriptions, illicit drugs), all items previously/currently identified as a safety concern.  The family member/significant other verbalizes understanding of the suicide prevention education information provided.  The family member/significant other agrees to remove the items of safety concern listed above.  Rozann Lesches 09/11/2019, 3:32 PM

## 2019-09-11 NOTE — Progress Notes (Signed)
Call placed to physical therapy department at [place]. This Probation officer was advised patient was going to be seen as soon as possible by physical therapy per orders.

## 2019-09-11 NOTE — BHH Suicide Risk Assessment (Signed)
[facility] Admission Suicide Risk Assessment   Nursing information obtained from:    Demographic factors:    Current Mental Status:    Loss Factors:    Historical Factors:    Risk Reduction Factors:     Total Time spent with patient: 1 hour Principal Problem: Adjustment disorder with depressed mood Diagnosis:  Principal Problem:   Adjustment disorder with depressed mood Active Problems:   Radiculopathy   Back pain  Subjective Data: Patient seen and chart reviewed.  Spoke with patient.  Reviewed recent and more distant entries in the medical record.  Spoke with nursing staff on the unit here.  Spoke also with the patient's mother by telephone.  Patient transferred to [place] from [facility] 2 days post back surgery.  Nursing in [place] had documented the patient making suicidal statements on admission.  Patient appears to have denied any suicidality or recent suicidal behavior and subsequent conversations.  Patient in interview today denies any suicidal thoughts whatsoever and also describes any symptoms consistent with major depression or psychotic disorder.  He is primarily concerned about pain in his back.  Mother also states that she has no concern about suicidality and had not heard the patient make any suicidal statements.  Continued Clinical Symptoms:    The "Alcohol Use Disorders Identification Test", Guidelines for Use in Primary Care, Second Edition.  World Pharmacologist [facility]). Score between 0-7:  no or low risk or alcohol related problems. Score between 8-15:  moderate risk of alcohol related problems. Score between 16-19:  high risk of alcohol related problems. Score 20 or above:  warrants further diagnostic evaluation for alcohol dependence and treatment.   CLINICAL FACTORS:   Chronic Pain   Musculoskeletal: Strength & Muscle Tone: decreased Gait & Station: unsteady, There is some dispute over his gait.  Physical therapy documentation in [facility] yesterday stated that he  was able to ambulate and nursing today reported that he was able to ambulate "around the unit".  The patient claims that since his surgery he has only been able to stand up briefly with assistance and only walk a short distance also with assistance Patient leans: N/A  Psychiatric Specialty Exam: Physical Exam  Nursing note and vitals reviewed. Constitutional: He appears well-developed and well-nourished.  HENT:  Head: Normocephalic and atraumatic.  Eyes: Pupils are equal, round, and reactive to light. Conjunctivae are normal.  Neck: Normal range of motion.  Cardiovascular: Regular rhythm and normal heart sounds.  Respiratory: Effort normal. No respiratory distress.  GI: Soft.  Musculoskeletal: Normal range of motion.  Neurological: He is alert.  Skin: Skin is warm and dry.  Psychiatric: His speech is normal and behavior is normal. Judgment and thought content normal. His mood appears anxious. Cognition and memory are normal.    Review of Systems  Constitutional: Negative.   HENT: Negative.   Eyes: Negative.   Respiratory: Negative.   Cardiovascular: Negative.   Gastrointestinal: Negative.   Musculoskeletal: Positive for back pain.  Skin: Negative.   Neurological: Negative.   Psychiatric/Behavioral: Negative for depression, hallucinations, memory loss, substance abuse and suicidal ideas. The patient has insomnia. The patient is not nervous/anxious.     Blood pressure 133/70, pulse (!) 104, temperature 98.4 F (36.9 C), temperature source Oral, resp. rate 18, height 6\' 1"  (1.854 m), weight 98.4 kg, SpO2 97 %.Body mass index is 28.63 kg/m.  General Appearance: Casual  Eye Contact:  Good  Speech:  Clear and Coherent  Volume:  Normal  Mood:  Euthymic  Affect:  Congruent  Thought Process:  Goal Directed  Orientation:  Full (Time, Place, and Person)  Thought Content:  Logical  Suicidal Thoughts:  No  Homicidal Thoughts:  No  Memory:  Immediate;   Fair Recent;   Fair Remote;    Fair  Judgement:  Fair  Insight:  Fair  Psychomotor Activity:  Decreased  Concentration:  Concentration: Fair  Recall:  [employer] of Knowledge:  Fair  Language:  Fair  Akathisia:  No  Handed:  Right  AIMS (if indicated):     Assets:  Desire for Improvement Housing Resilience Social Support  ADL's:  Impaired  Cognition:  WNL  Sleep:         COGNITIVE FEATURES THAT CONTRIBUTE TO RISK:  None    SUICIDE RISK:   Minimal: No identifiable suicidal ideation.  Patients presenting with no risk factors but with morbid ruminations; may be classified as minimal risk based on the severity of the depressive symptoms  PLAN OF CARE: Based on the whole evaluation it does not appear to me that the patient needs or is likely to benefit from inpatient hospitalization and it also does not appear that he is at high risk for suicidal behavior.  I will be discussing this with other members of the treatment team on the ward but it is likely that we will be discharging him to his mother's house.  I certify that inpatient services furnished can reasonably be expected to improve the patient's condition.   Mordecai Rasmussen, MD 09/11/2019, 1:25 PM

## 2019-09-11 NOTE — BH Assessment (Addendum)
Patient has been accepted to [facility].  Accepting physician is Dr. Dwyane Dee.  Psych Nurse Practitioner Darnelle Maffucci Money)  Attending Physician will be Dr. Weber Cooks.  Patient has been assigned to room 301, by [place] Charge Nurse Gypsy Balsam.  Call report to [phone].  Representative/Transfer Coordinator is [employer] Staff Percell Locus, Education officer, museum) made aware of acceptance.

## 2019-09-11 NOTE — Progress Notes (Signed)
Patient not recommended for psychiatric follow up by psychiatrist.   Assunta Curtis, MSW, LCSW 09/11/2019 3:31 PM

## 2019-09-11 NOTE — Tx Team (Signed)
Initial Treatment Plan 09/11/2019 2:09 PM Benjamin Hernandez NTZ:[id number]    PATIENT STRESSORS: Other: Suicidal thoughts  Depression   PATIENT STRENGTHS: Ability for insight Active sense of humor Average or above average intelligence Communication skills Financial means General fund of knowledge Religious Affiliation Special hobby/interest Supportive family/friends Work skills   PATIENT IDENTIFIED PROBLEMS: Suicidal thoughts expressed 09/11/2019  Depression 09/11/2019                   DISCHARGE CRITERIA:  Adequate post-discharge living arrangements Improved stabilization in mood, thinking, and/or behavior Motivation to continue treatment in a less acute level of care Need for constant or close observation no longer present  PRELIMINARY DISCHARGE PLAN: Outpatient therapy Return to previous living arrangement Return to previous work or school arrangements  PATIENT/FAMILY INVOLVEMENT: This treatment plan has been presented to and reviewed with the patient, Benjamin Hernandez.The patient has been given the opportunity to ask questions and make suggestions.  Reyes Ivan, RN 09/11/2019, 2:09 PM

## 2019-09-11 NOTE — Plan of Care (Signed)
Care Planning-  Patient has been calm and cooperative. Denies si/hi/avh, able to verbally contract for safety, verbalizes understanding of what this means. Attending has approved patient for discharge. Will get patient ready for discharging.

## 2019-09-11 NOTE — Plan of Care (Signed)
Patient recently admitted to the unit. Pt. Denies si/hi/avh, able to verbally contract for safety upon discharge.    Problem: Education: Goal: Knowledge of [place] [facility] Education information/materials will improve Outcome: Progressing Goal: Mental status will improve Outcome: Progressing   Problem: Health Behavior/Discharge Planning: Goal: Compliance with treatment plan for underlying cause of condition will improve Outcome: Progressing   Problem: Safety: Goal: Periods of time without injury will increase Outcome: Progressing   Problem: Self-Concept: Goal: Ability to disclose and discuss suicidal ideas will improve Outcome: Progressing

## 2019-09-11 NOTE — Progress Notes (Signed)
  [facility] Adult Case Management Discharge Plan :  Will you be returning to the same living situation after discharge:  Yes,  pt reporrts returning home At discharge, do you have transportation home?: Yes,  mother is providing transportation. Do you have the ability to pay for your medications: No.  Release of information consent forms completed and in the chart;  Patient's signature needed at discharge.  Patient to Follow up at: Patient not recommended for psychiatric follow up.  Next level of care provider has access to [place] and Suicide Prevention discussed: Yes,  SPE completed with mother.   Have you used any form of tobacco in the last 30 days? (Cigarettes, Smokeless Tobacco, Cigars, and/or Pipes): No  Has patient been referred to the Quitline?: Patient refused referral  Patient has been referred for addiction treatment: Pt. refused referral  Rozann Lesches, LCSW 09/11/2019, 3:29 PM

## 2019-09-11 NOTE — TOC Transition Note (Addendum)
Transition of Care [facility]) - CM/SW Discharge Note   Patient Details  Name: Benjamin Hernandez MRN: [id number] Date of Birth: 15-Dec-1980  [facility] CM/SW Contact:  Benard Halsted, [place] Phone Number: 09/11/2019, 9:52 AM   Clinical Narrative:    Patient has been accepted at [facility] and will be able to discharge after 11:30.   [facility] to[facility] Anticipated DC date: 09/11/19 Family notified: Patient oriented x4 Transport by: Brandon Melnick after 11:30am   Per MD patient ready for DC to [facility]. RN, patient, patient's family, and facility notified of DC. IVC sent to facility. RN to call report prior to discharge [phone] Room 303). IVC packet on chart.   CSW will sign off for now as social work intervention is no longer needed. Please consult [place] again if new needs arise.  Cedric Fishman, LCSW Clinical Social Worker [phone]    Final next level of care: [facility] Barriers to Discharge: No Barriers Identified   Patient Goals and CMS Choice        Discharge Placement                       Discharge Plan and Services                                     Social Determinants of Health (SDOH) Interventions     Readmission Risk Interventions No flowsheet data found.

## 2019-09-11 NOTE — BHH Suicide Risk Assessment (Signed)
[facility] Discharge Suicide Risk Assessment   Principal Problem: Adjustment disorder with depressed mood Discharge Diagnoses: Principal Problem:   Adjustment disorder with depressed mood Active Problems:   Radiculopathy   Back pain   Total Time spent with patient: 30 minutes  Musculoskeletal: Strength & Muscle Tone: within normal limits Gait & Station: unsteady Patient leans: N/A  Psychiatric Specialty Exam: Review of Systems  Constitutional: Negative.   HENT: Negative.   Eyes: Negative.   Respiratory: Negative.   Cardiovascular: Negative.   Gastrointestinal: Negative.   Musculoskeletal: Positive for back pain.  Skin: Negative.   Neurological: Negative.   Psychiatric/Behavioral: Negative.     Blood pressure 133/70, pulse (!) 104, temperature 98.4 F (36.9 C), temperature source Oral, resp. rate 18, height 6\' 1"  (1.854 m), weight 98.4 kg, SpO2 97 %.Body mass index is 28.63 kg/m.  General Appearance: Casual  Eye Contact::  Good  Speech:  Normal Rate409  Volume:  Normal  Mood:  Euthymic  Affect:  Congruent  Thought Process:  Goal Directed  Orientation:  Full (Time, Place, and Person)  Thought Content:  Logical  Suicidal Thoughts:  No  Homicidal Thoughts:  No  Memory:  Immediate;   Fair Recent;   Fair Remote;   Fair  Judgement:  Fair  Insight:  Fair  Psychomotor Activity:  Normal  Concentration:  Fair  Recall:  [employer] [facility] [place]  Language: Fair  Akathisia:  No  Handed:  Right  AIMS (if indicated):     Assets:  Desire for Improvement Housing Resilience Social Support  Sleep:     Cognition: WNL  ADL's:  Intact   Mental Status Per Nursing Assessment::   On Admission:  NA(Denies)  Demographic Factors:  Male and Caucasian  Loss Factors: Decline in physical health  Historical Factors: NA  Risk Reduction Factors:   Responsible for children under 81 years of age, Sense of responsibility to family, Living with another person, especially a  relative, Positive social support and Positive therapeutic relationship  Continued Clinical Symptoms:  Dysthymia  Cognitive Features That Contribute To Risk:  None    Suicide Risk:  Minimal: No identifiable suicidal ideation.  Patients presenting with no risk factors but with morbid ruminations; may be classified as minimal risk based on the severity of the depressive symptoms    Plan Of Care/Follow-up recommendations:  Activity:  Activity as tolerated Diet:  Regular diet Other:  Please follow-up with the surgeon as directed.  They wanted to see you back in 2 weeks.  Alethia Berthold, MD 09/11/2019, 4:02 PM

## 2019-09-11 NOTE — Progress Notes (Addendum)
Received patient from [facility] Emergency Department. Patient skin assessment completed with Jinny Blossom, RN, skin is intact, with the exception of medium size surgical wound that has a dressing covering it in place and intact on his lower middle of back. No contraband found with all unit prohibited items locked and stored away for discharge. Pt. Was admitted under the services of, Dr. Weber Cooks.   Patient upon arrival to the unit by way of [place] Department deputies was immediately having to be placed in wheelchair due to, "I won't be able to walk my back is causes me excruciating pain, I need a wheelchair or a bed". Pt. Immediately upon arrival was placed in a wheelchair and escorted to the search and admissions room, but a few minutes after being placed in the wheelchair reports, "I won't be able to be in this wheelchair much longer, the pain is almost unbearable sitting in the chair". Pt. Was then (due to not being able to tolerate wheelchair) taken from search room and placed in bed in patient's room. Call placed to [facility] supervisor, [facility] supervisor, social worker (Percell Locus at [facility]), and attending for review of patient presentation that is incongruent from report received from [facility] and [place].   Provider attending has been notified of incongruence of patient presentation given in report extensively and provider has seen the patient and anticipates having the patient discharged due to denying Si/HI/AVH, contracting for safety. Will continue to monitor closely due to high falls risk and post surgical wound on lower back for safety, until attending provider gives anticipated orders for discharge or continuation of care if plan of treatment changes. Will monitor pain control closely as well.

## 2019-09-11 NOTE — Progress Notes (Signed)
I received report for the pt. I took report for the charge nurse because he was busy outside the nurse's station. When I asked when the patient when he would be coming he stated "the sheriff is on his way". I explained to Sonia Baller that we were not expecting the pt that early. During report I asked if the patient was now able to walk . Sonia Baller stated that the pt came in with a walker but now did not need it. Collier Bullock RN

## 2019-09-11 NOTE — Progress Notes (Signed)
D:Patient denies SI/HI/AVH, able to contract for safety at this time. Pt appears calm and cooperative, and no distress noted.   A: All Personal items in locker returned to pt. Upon discharge. Pt. Only arrived with a white plastic bag that was sealed then returned to patient upon discharge.  R:  Pt States he will comply with discharge planning put into place by providers and take MEDS as prescribed. Pt escorted out of the building by this Probation officer.

## 2019-09-11 NOTE — Progress Notes (Signed)
Patient being seen by physical therapy at this time.

## 2019-09-11 NOTE — BHH Group Notes (Signed)
LCSW Group Therapy Note  09/11/2019 1:00 PM  Type of Therapy and Topic:  Group Therapy:  Feelings around Relapse and Recovery  Participation Level:  Did Not Attend   Description of Group:    Patients in this group will discuss emotions they experience before and after a relapse. They will process how experiencing these feelings, or avoidance of experiencing them, relates to having a relapse. Facilitator will guide patients to explore emotions they have related to recovery. Patients will be encouraged to process which emotions are more powerful. They will be guided to discuss the emotional reaction significant others in their lives may have to their relapse or recovery. Patients will be assisted in exploring ways to respond to the emotions of others without this contributing to a relapse.  Therapeutic Goals: 1. Patient will identify two or more emotions that lead to a relapse for them 2. Patient will identify two emotions that result when they relapse 3. Patient will identify two emotions related to recovery 4. Patient will demonstrate ability to communicate their needs through discussion and/or role plays   Summary of Patient Progress: X    Therapeutic Modalities:   Cognitive Behavioral Therapy Solution-Focused Therapy Assertiveness Training Relapse Prevention Therapy   Sejla Marzano, MSW, LCSW 09/11/2019 12:20 PM  

## 2019-09-11 NOTE — Progress Notes (Signed)
PT Cancellation Note and Discharge  Patient Details Name: Benjamin Hernandez MRN: [id number] DOB: [birth year]   Cancelled Treatment:    Reason Eval/Treat Not Completed: Pt sleeping upon PT arrival to the unit. Discussed pt case with RN who reports that pt has been mobilizing around unit without difficulty and further PT intervention is not warranted at this time. We did review all necessary precautions and recommendations from a PT standpoint yesterday, and pt has an education handout to take with him at d/c. Will sign off at this time. If needs change, please reconsult.    Thelma Comp 09/11/2019, 10:56 AM   Rolinda Roan, PT, DPT Acute Rehabilitation Services Pager: [phone] Office: [phone]

## 2019-09-11 NOTE — Progress Notes (Signed)
[place] officers transported patient to [facility] @1107 . All patient's belongings in a bag given to officers. Patient pleasant and cooperative.

## 2019-09-11 NOTE — Evaluation (Signed)
Physical Therapy Evaluation Patient Details Name: Benjamin Hernandez MRN: [id number] DOB: 1980/12/13 Today's Date: 09/11/2019   History of Present Illness  Benjamin Hernandez is a 38 y.o. male who presents to [facility] due to suicidal concerns reported during hospitalization status post s/p L5-S1 TLIF/PLF (09/09/19).  Clinical Impression  Upon evaluation, patient alert and oriented; follows commands and demonstrates good insight/safety awareness.  Verbalizes "I promise I'm not going to hurt myself.  I wouldn't do that" during session.  Bilat UE/LE strength and ROM grossly symmetrical and WFL; no focal weakness, sensory deficit or radicular symptoms appreciated.  Able to complete bed mobility (towards R side of bed) with mod indep; sit/stand, basic transfers and gait (200') with RW, sup/mod indep.  Decreased cadence/gait speed with heavy WBing bilat UEs, but no overt buckling or LOB noted during session.  Appears comfortable and confident in gait abilities in current post-op state. Verbalizes understanding of all back precautions and indep adheres throughout session; appropriately and independently dons/doffs/manages TLSO with mobility efforts. Would benefit from skilled PT to address above deficits and promote optimal return to PLOF; recommend transition to home with outpatient follow up per surgeon.    Follow Up Recommendations Follow surgeon's recommendation for DC plan and follow-up therapies    Equipment Recommendations  (has necessary equipment)    Recommendations for Other Services       Precautions / Restrictions Precautions Precautions: Back Required Braces or Orthoses: Spinal Brace Spinal Brace: Thoracolumbosacral orthotic Restrictions Weight Bearing Restrictions: No      Mobility  Bed Mobility Overal bed mobility: Modified Independent Bed Mobility: Supine to Sit;Sit to Supine     Supine to sit: Modified independent (Device/Increase time) Sit to supine: Modified independent  (Device/Increase time)   General bed mobility comments: good adherence to and demonstration of log rolling technique; prefers getting in/out from R side of bed for pain control  Transfers Overall transfer level: Needs assistance Equipment used: Rolling walker (2 wheeled)   Sit to Stand: Supervision;Modified independent (Device/Increase time)         General transfer comment: good LE strength/power  Ambulation/Gait Ambulation/Gait assistance: Supervision Gait Distance (Feet): 200 Feet Assistive device: Rolling walker (2 wheeled)       General Gait Details: reciprocal stepping pattern with good step height/length; heavy WBing bilat UEs (due to pain).  Stable, comfortable and confident in gait abilities.  No overt buckling or LOB noted.  Stairs            Wheelchair Mobility    Modified Rankin (Stroke Patients Only)       Balance Overall balance assessment: Needs assistance Sitting-balance support: No upper extremity supported;Feet supported Sitting balance-Leahy Scale: Normal     Standing balance support: Bilateral upper extremity supported Standing balance-Leahy Scale: Fair Standing balance comment: prefers UE support for pain control                             Pertinent Vitals/Pain Pain Score: 7  Pain Location: back Pain Descriptors / Indicators: Aching;Grimacing Pain Intervention(s): Limited activity within patient's tolerance;Monitored during session;Premedicated before session;Repositioned    Home Living Family/patient expects to be discharged to:: Private residence Living Arrangements: Alone Available Help at Discharge: Family;Friend(s);Available PRN/intermittently Type of Home: House Home Access: Stairs to enter Entrance Stairs-Rails: Right;Left;Can reach both Entrance Stairs-Number of Steps: 2 steps Home Layout: One level Home Equipment: Walker - 2 wheels;Cane - single point;Grab bars - tub/shower;Shower seat(lift chair)  Prior  Function Level of Independence: Independent         Comments: having difficulty due to LE numbness/weakness reports feel/severe ankle sprain 3-4 months ago due to numbness     Hand Dominance        Extremity/Trunk Assessment   Upper Extremity Assessment Upper Extremity Assessment: Overall WFL for tasks assessed    Lower Extremity Assessment Lower Extremity Assessment: (grossly at least 4/5; denies sensory deficit, radicular symptoms)       Communication   Communication: No difficulties  Cognition Arousal/Alertness: Awake/alert Behavior During Therapy: WFL for tasks assessed/performed Overall Cognitive Status: Within Functional Limits for tasks assessed                                        General Comments      Exercises Other Exercises Other Exercises: Toilet transfer, ambulatory with RW, sup/mod indep-good ability to manage RW over surface changes, narrowed spaces; good adherence to back precautiosn with functional tasks   Assessment/Plan    PT Assessment Patient needs continued PT services  PT Problem List Decreased strength;Decreased knowledge of use of DME;Decreased mobility;Decreased balance;Decreased knowledge of precautions;Pain;Decreased activity tolerance       PT Treatment Interventions DME instruction;Stair training;Therapeutic activities;Functional mobility training;Gait training;Patient/family education    PT Goals (Current goals can be found in the Care Plan section)  Acute Rehab PT Goals Patient Stated Goal: to go home PT Goal Formulation: With patient Time For Goal Achievement: 09/11/19 Potential to Achieve Goals: Good    Frequency Min 2X/week   Barriers to discharge        Co-evaluation               AM-PAC PT "6 Clicks" Mobility  Outcome Measure Help needed turning from your back to your side while in a flat bed without using bedrails?: None Help needed moving from lying on your back to sitting on the side of  a flat bed without using bedrails?: None Help needed moving to and from a bed to a chair (including a wheelchair)?: None Help needed standing up from a chair using your arms (e.g., wheelchair or bedside chair)?: None Help needed to walk in hospital room?: None Help needed climbing 3-5 steps with a railing? : A Little 6 Click Score: 23    End of Session Equipment Utilized During Treatment: Back brace Activity Tolerance: Patient tolerated treatment well;Patient limited by pain Patient left: in bed Nurse Communication: Mobility status PT Visit Diagnosis: Difficulty in walking, not elsewhere classified (R26.2);Pain    Time: 1400-1415 PT Time Calculation (min) (ACUTE ONLY): 15 min   Charges:   PT Evaluation $PT Eval Moderate Complexity: 1 Mod          Syla Devoss H. Manson Passey, PT, DPT, NCS 09/11/19, 2:40 PM [phone]

## 2019-09-11 NOTE — Progress Notes (Signed)
CSW still awaiting call back from [facility] regarding availability. CSW spoke with [facility]; they will contact CSW back once they find out about staffing availability.   Percell Locus Elica Almas LCSW [phone]

## 2019-09-15 MED FILL — Sodium Chloride IV Soln 0.9%: INTRAVENOUS | Qty: 2000 | Status: AC

## 2019-09-15 MED FILL — Heparin Sodium (Porcine) Inj 1000 Unit/ML: INTRAMUSCULAR | Qty: 30 | Status: AC

## 2019-09-16 NOTE — Discharge Summary (Signed)
Patient ID: Benjamin Hernandez MRN: [id number] DOB/AGE: 1981/03/21 38 y.o.  Admit date: 09/09/2019 Discharge date: 09/10/2019  Admission Diagnoses:  Active Problems:   Radiculopathy   Discharge Diagnoses:  Same  Past Medical History:  Diagnosis Date  . Spontaneous pneumothorax     Surgeries: Procedure(s): LEFT-SIDED LUMBAR FIVE- SACRUM ONE TRANSFORAMINAL LUMBAR INTERBODY FUSION WITH INSTRUMENTATION AND ALLOGRAFT on 09/09/2019   Consultants: Psychiatry  Discharged Condition: Improved  Hospital Course: Benjamin Hernandez is an 38 y.o. male who was admitted 09/09/2019 for operative treatment of <principal problem not specified>. Patient has severe unremitting pain that affects sleep, daily activities, and work/hobbies. After pre-op clearance the patient was taken to the operating room on 09/09/2019 and underwent  Procedure(s): LEFT-SIDED LUMBAR FIVE- SACRUM ONE TRANSFORAMINAL LUMBAR INTERBODY FUSION WITH INSTRUMENTATION AND ALLOGRAFT.    Patient was given perioperative antibiotics:  Anti-infectives (From admission, onward)   Start     Dose/Rate Route Frequency Ordered Stop   09/09/19 1700  ceFAZolin (ANCEF) IVPB 2g/100 mL premix     2 g 200 mL/hr over 30 Minutes Intravenous Every 8 hours 09/09/19 1431 09/10/19 0136   09/09/19 0645  ceFAZolin (ANCEF) IVPB 2g/100 mL premix     2 g 200 mL/hr over 30 Minutes Intravenous On call to O.R. 09/09/19 2706 09/09/19 0919       Patient was given sequential compression devices, early ambulation to prevent DVT.  Patient benefited maximally from hospital stay and there was compliaction of pt reporting suicide attempt and suicidal ideations, psychiatry consulted  Recent vital signs: BP 133/70 (BP Location: Left Arm)   Pulse (!) 104   Temp 98.4 F (36.9 C) (Oral)   Resp 18   Ht 6' (1.829 m)   Wt 98.8 kg   SpO2 97%   BMI 29.54 kg/m    Discharge Medications:   Allergies as of 09/11/2019      Reactions   Adhesive [tape] Rash   laxtex  type adhesives   Latex Rash      Medication List    You have not been prescribed any medications.     Diagnostic Studies: Dg Lumbar Spine 2-3 Views  Result Date: 09/09/2019 CLINICAL DATA:  L5-S1 fusion. EXAM: DG C-ARM 1-60 MIN; LUMBAR SPINE - 2-3 VIEW COMPARISON:  09/09/2019 at 0904 hours. FINDINGS: 2 intraoperative spot fluoro images show the patient to be status post PLIF at L5-S1. No evidence for immediate hardware complications. IMPRESSION: Intraoperative assessment during L5-S1 PLIF. Electronically Signed   By: Misty Stanley M.D.   On: 09/09/2019 13:27   Dg Lumbar Spine 1 View  Result Date: 09/09/2019 CLINICAL DATA:  L5-S1 TLIF EXAM: LUMBAR SPINE - 1 VIEW COMPARISON:  Lumbar discogram 08/07/2019 FINDINGS: Intraoperative localization at L5 and S1 using spinal needles. L5-S1 disc narrowing with posterior ridging. IMPRESSION: Intraoperative localization at the L5 and S1 spinous processes. Electronically Signed   By: Monte Fantasia M.D.   On: 09/09/2019 11:55   Dg C-arm 1-60 Min  Result Date: 09/09/2019 CLINICAL DATA:  L5-S1 fusion. EXAM: DG C-ARM 1-60 MIN; LUMBAR SPINE - 2-3 VIEW COMPARISON:  09/09/2019 at 0904 hours. FINDINGS: 2 intraoperative spot fluoro images show the patient to be status post PLIF at L5-S1. No evidence for immediate hardware complications. IMPRESSION: Intraoperative assessment during L5-S1 PLIF. Electronically Signed   By: Misty Stanley M.D.   On: 09/09/2019 13:27    Disposition:    POD #1 s/p L5/S1 decompression and fusion, doing well from standpoint of surgery  -  up with PT/OT, encourage ambulation - Percocet for pain, Valium for muscle spasms - may d/c home today with f/u in 2 weeks, however, we do need await input from psychiatry. I did express to him the reasoning behind this. -Scripts for pain sent to pharmacy electronically  -D/C instructions sheet printed and in chart -D/C today  -F/U in office 2 weeks   Signed: Eilene Ghazi Eisha Chatterjee 09/16/2019, 1:27  PM

## 2020-10-28 IMAGING — CT CT BIOPSY
2 of 6 series · 12 of 32 positions shown, 18 images · non-contrast
Comparison: none

CLINICAL DATA: Left sacroiliac pain.

[Series 2: needle -guided injection · axial · 0.81mm/px · z∈[-115,-37]mm · 10 of 49 slices shown, 16 images (1 of 2)]
[im 5/49  soft-tissue]
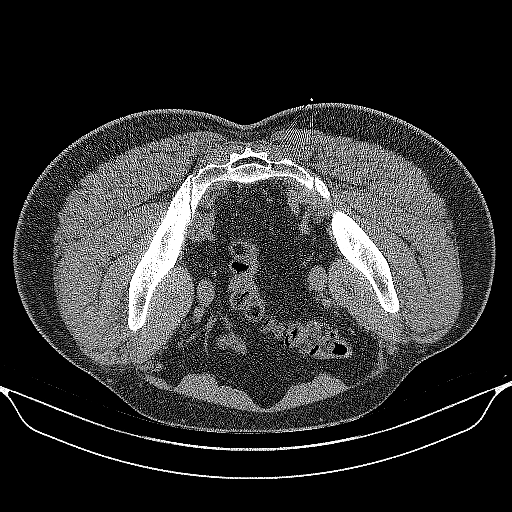
[im 5/49  bone]
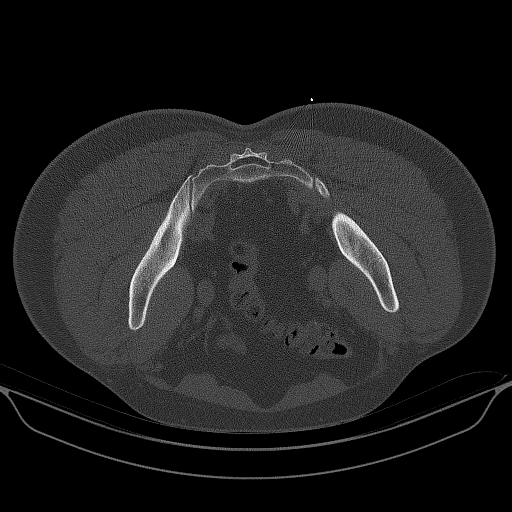
[im 9/49  soft-tissue]
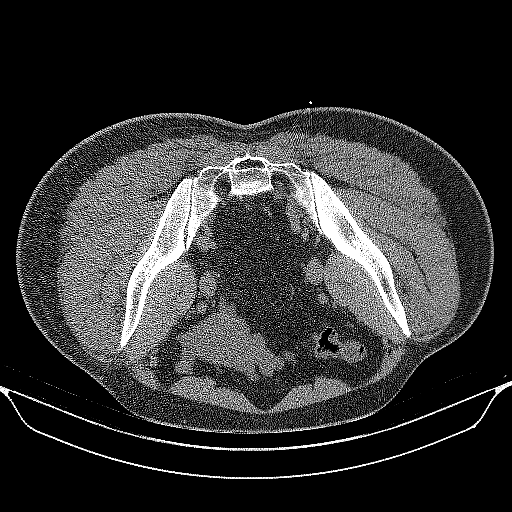
[im 14/49  soft-tissue]
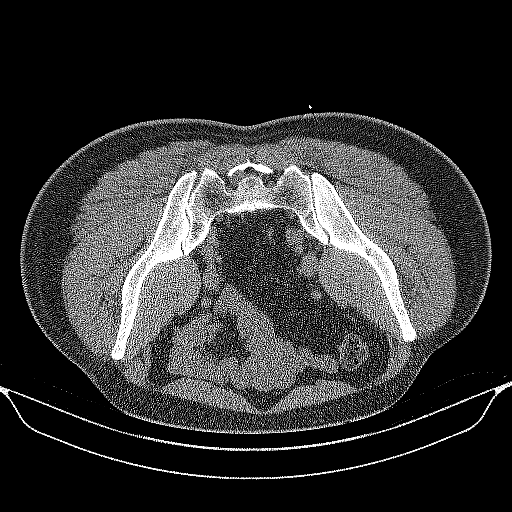
[im 18/49  soft-tissue]
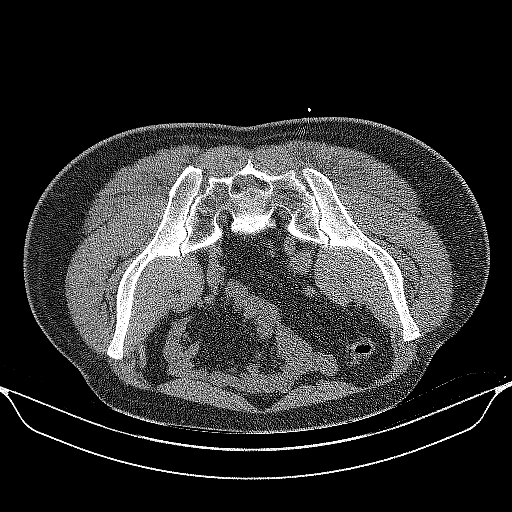
[im 22/49  soft-tissue]
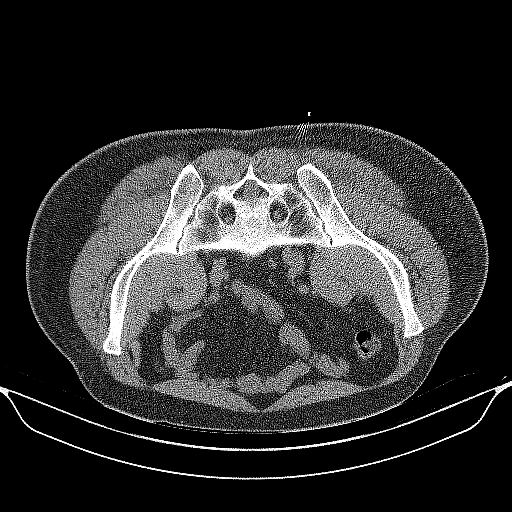
[im 27/49  soft-tissue]
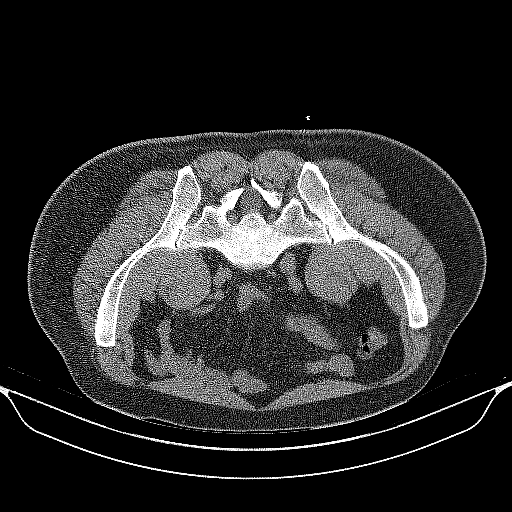
[im 31/49  soft-tissue]
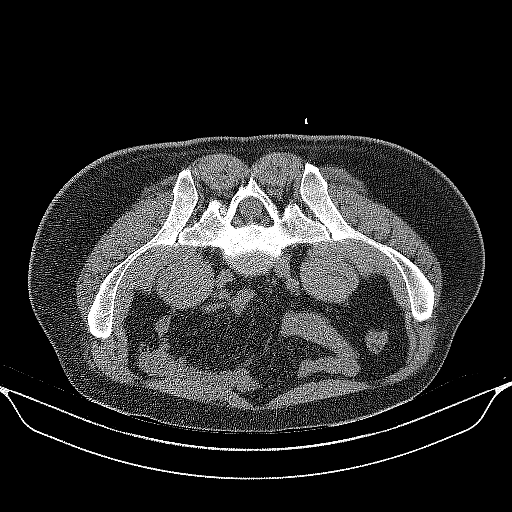
[im 31/49  lung]
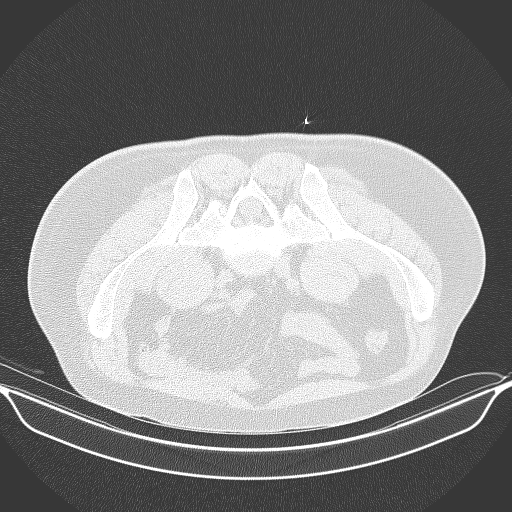
[im 35/49  soft-tissue]
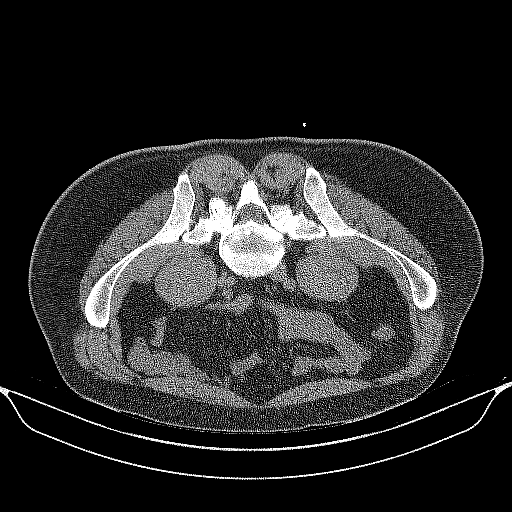
[im 35/49  lung]
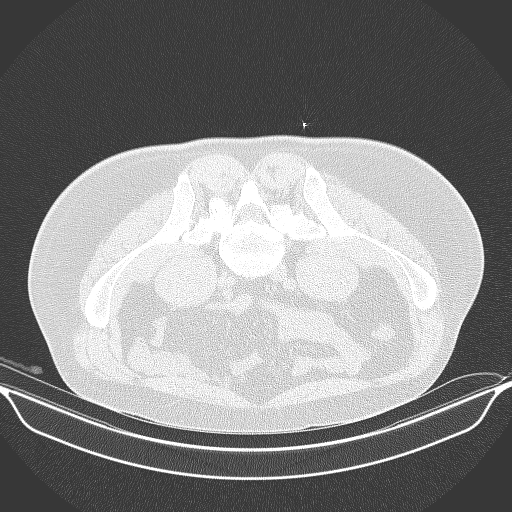
[im 40/49  soft-tissue]
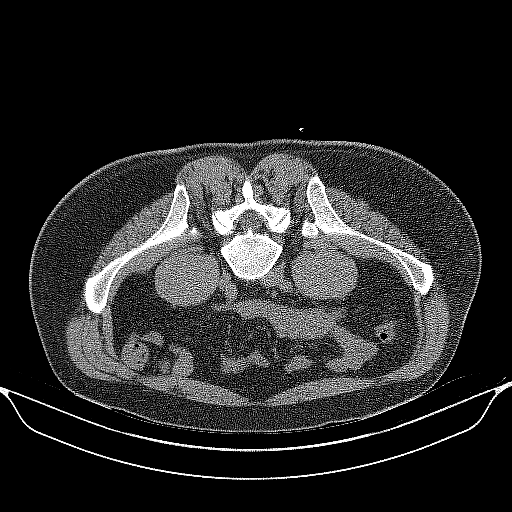
[im 40/49  lung]
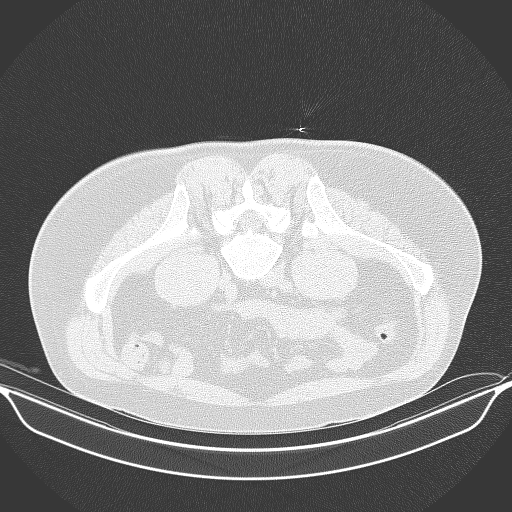
[im 40/49  bone]
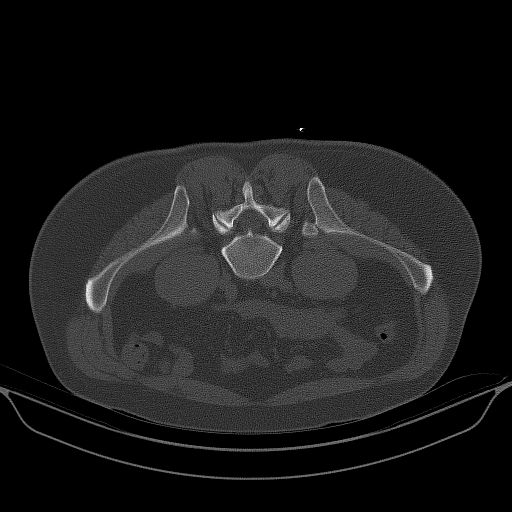
[im 44/49  soft-tissue]
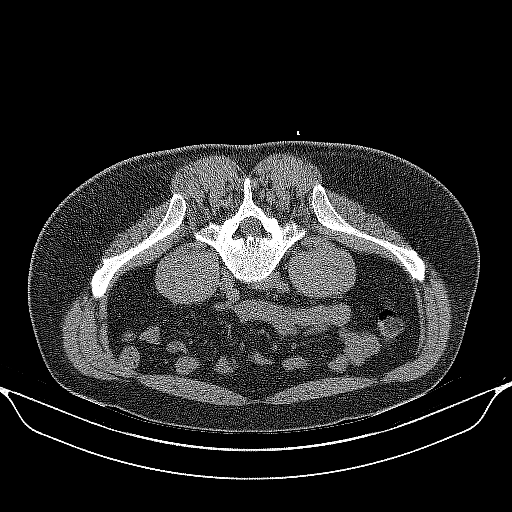
[im 44/49  lung]
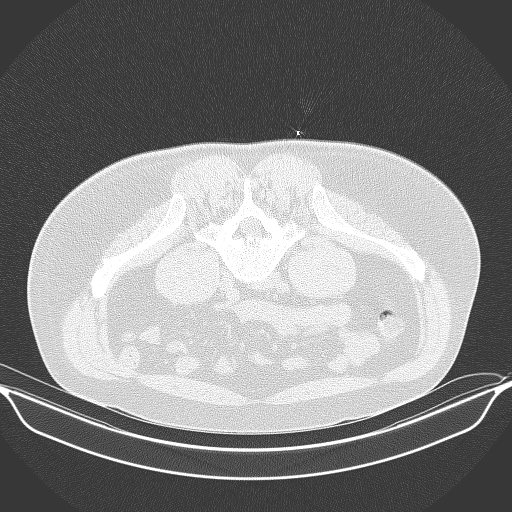

[Series 3: needle -guided injection · axial · 0.81mm/px · z∈[-113,-105]mm · 2 of 14 slices shown (2 of 2)]
[im 5/14  soft-tissue]
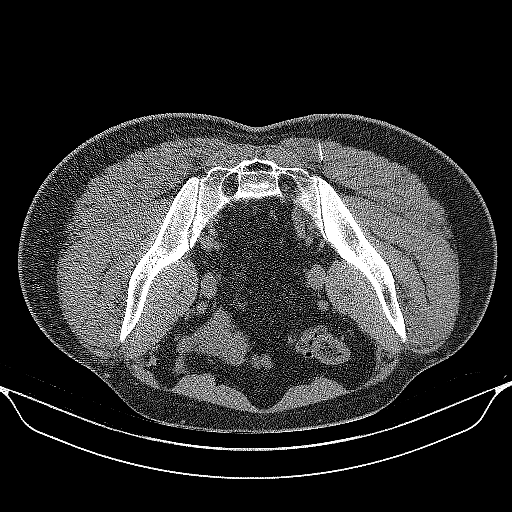
[im 9/14  soft-tissue]
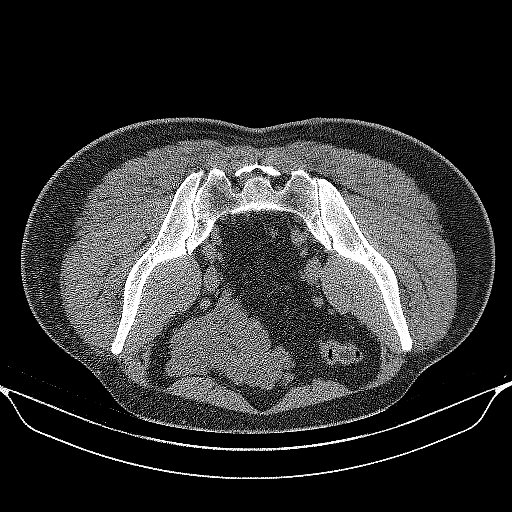

[12 of 32 positions shown; findings below may reference images not displayed]

EXAM:
CT GUIDED LEFT SI JOINT INJECTION



After local anesthesia with 1% lidocaine without epinephrine and
subsequent deep anesthesia, a 3.5 inch 22 gauge spinal needle was
advanced into the left SI joint under intermittent CT guidance.

Injection of a small amount of Isovue-M 200 demonstrated
intra-articular contrast spread. Subsequently, 2 mL of 0.5%
bupivacaine were injected into the left SI joint. The needle was
removed and a sterile dressing applied.

No complications were observed.
IMPRESSION: Successful CT-guided left SI joint injection.

## 2021-01-30 DIAGNOSIS — M5412 Radiculopathy, cervical region: Secondary | ICD-10-CM

## 2021-01-30 DIAGNOSIS — R200 Anesthesia of skin: Secondary | ICD-10-CM

## 2021-01-30 HISTORY — DX: Anesthesia of skin: R20.0

## 2021-01-30 HISTORY — DX: Radiculopathy, cervical region: M54.12

## 2022-08-22 ENCOUNTER — Encounter: Payer: Self-pay | Admitting: Cardiology

## 2022-08-30 ENCOUNTER — Ambulatory Visit: Payer: BC Managed Care – PPO | Attending: Cardiology | Admitting: Cardiology

## 2022-08-30 ENCOUNTER — Encounter: Payer: Self-pay | Admitting: Cardiology

## 2022-08-30 DIAGNOSIS — G4733 Obstructive sleep apnea (adult) (pediatric): Secondary | ICD-10-CM | POA: Diagnosis not present

## 2022-08-30 DIAGNOSIS — Z8709 Personal history of other diseases of the respiratory system: Secondary | ICD-10-CM | POA: Insufficient documentation

## 2022-08-30 DIAGNOSIS — R9431 Abnormal electrocardiogram [ECG] [EKG]: Secondary | ICD-10-CM | POA: Insufficient documentation

## 2022-08-30 DIAGNOSIS — R0789 Other chest pain: Secondary | ICD-10-CM | POA: Diagnosis not present

## 2022-08-30 MED ORDER — NITROGLYCERIN 0.4 MG SL SUBL: 0.4000 mg | SUBLINGUAL_TABLET | SUBLINGUAL | 6 refills | Status: DC | PRN

## 2022-08-30 MED ORDER — ASPIRIN 81 MG PO TBEC: 81.0000 mg | DELAYED_RELEASE_TABLET | Freq: Every day | ORAL | 3 refills | Status: AC

## 2022-08-30 NOTE — Patient Instructions (Signed)
Medication Instructions:  Your physician recommends that you continue on your current medications as directed. Please refer to the Current Medication list given to you today.  *If you need a refill on your cardiac medications before your next appointment, please call your pharmacy*   Lab Work: None ordered If you have labs (blood work) drawn today and your tests are completely normal, you will receive your results only by: MyChart Message (if you have MyChart) OR A paper copy in the mail If you have any lab test that is abnormal or we need to change your treatment, we will call you to review the results.   Testing/Procedures: Your physician has requested that you have an echocardiogram. Echocardiography is a painless test that uses sound waves to create images of your heart. It provides your doctor with information about the size and shape of your heart and how well your heart's chambers and valves are working. This procedure takes approximately one hour. There are no restrictions for this procedure.  Your physician has recommended that you have a sleep study. This test records several body functions during sleep, including: brain activity, eye movement, oxygen and carbon dioxide blood levels, heart rate and rhythm, breathing rate and rhythm, the flow of air through your mouth and nose, snoring, body muscle movements, and chest and belly movement.    Follow-Up: At [facility], you and your health needs are our priority.  As part of our continuing mission to provide you with exceptional heart care, we have created designated Provider Care Teams.  These Care Teams include your primary Cardiologist (physician) and Advanced Practice Providers (APPs -  Physician Assistants and Nurse Practitioners) who all work together to provide you with the care you need, when you need it.  We recommend signing up for the patient portal called "MyChart".  Sign up information is provided on this After Visit  Summary.  MyChart is used to connect with patients for Virtual Visits (Telemedicine).  Patients are able to view lab/test results, encounter notes, upcoming appointments, etc.  Non-urgent messages can be sent to your provider as well.   To learn more about what you can do with MyChart, go to [email or web address].    Your next appointment:   6 week(s)  The format for your next appointment:   In Person  Provider:   Gypsy Balsam, MD   Other Instructions Echocardiogram An echocardiogram is a test that uses sound waves (ultrasound) to produce images of the heart. Images from an echocardiogram can provide important information about: Heart size and shape. The size and thickness and movement of your heart's walls. Heart muscle function and strength. Heart valve function or if you have stenosis. Stenosis is when the heart valves are too narrow. If blood is flowing backward through the heart valves (regurgitation). A tumor or infectious growth around the heart valves. Areas of heart muscle that are not working well because of poor blood flow or injury from a heart attack. Aneurysm detection. An aneurysm is a weak or damaged part of an artery wall. The wall bulges out from the normal force of blood pumping through the body. Tell a health care provider about: Any allergies you have. All medicines you are taking, including vitamins, herbs, eye drops, creams, and over-the-counter medicines. Any blood disorders you have. Any surgeries you have had. Any medical conditions you have. Whether you are pregnant or may be pregnant. What are the risks? Generally, this is a safe test. However, problems may occur, including an allergic  reaction to dye (contrast) that may be used during the test. What happens before the test? No specific preparation is needed. You may eat and drink normally. What happens during the test? You will take off your clothes from the waist up and put on a hospital  gown. Electrodes or electrocardiogram (ECG)patches may be placed on your chest. The electrodes or patches are then connected to a device that monitors your heart rate and rhythm. You will lie down on a table for an ultrasound exam. A gel will be applied to your chest to help sound waves pass through your skin. A handheld device, called a transducer, will be pressed against your chest and moved over your heart. The transducer produces sound waves that travel to your heart and bounce back (or "echo" back) to the transducer. These sound waves will be captured in real-time and changed into images of your heart that can be viewed on a video monitor. The images will be recorded on a computer and reviewed by your health care provider. You may be asked to change positions or hold your breath for a short time. This makes it easier to get different views or better views of your heart. In some cases, you may receive contrast through an IV in one of your veins. This can improve the quality of the pictures from your heart. The procedure may vary among health care providers and hospitals.   What can I expect after the test? You may return to your normal, everyday life, including diet, activities, and medicines, unless your health care provider tells you not to do that. Follow these instructions at home: It is up to you to get the results of your test. Ask your health care provider, or the department that is doing the test, when your results will be ready. Keep all follow-up visits. This is important. Summary An echocardiogram is a test that uses sound waves (ultrasound) to produce images of the heart. Images from an echocardiogram can provide important information about the size and shape of your heart, heart muscle function, heart valve function, and other possible heart problems. You do not need to do anything to prepare before this test. You may eat and drink normally. After the echocardiogram is completed, you  may return to your normal, everyday life, unless your health care provider tells you not to do that. This information is not intended to replace advice given to you by your health care provider. Make sure you discuss any questions you have with your health care provider. Document Revised: 07/12/2020 Document Reviewed: 07/12/2020 Elsevier Patient Education  2021 [employer]  Nitroglycerin Sublingual Tablets What is this medication? NITROGLYCERIN (nye troe GLI ser in) prevents and treats chest pain (angina). It works by relaxing blood vessels, which decreases the amount of work the heart has to do. It belongs to a group of medications called nitrates. This medicine may be used for other purposes; ask your health care provider or pharmacist if you have questions. COMMON BRAND NAME(S): Nitroquick, Nitrostat, Nitrotab What should I tell my care team before I take this medication? They need to know if you have any of these conditions: Anemia Head injury, recent stroke, or bleeding in the brain Liver disease Previous heart attack An unusual or allergic reaction to nitroglycerin, other medications, foods, dyes, or preservatives Pregnant or trying to get pregnant Breast-feeding How should I use this medication? Take this medication by mouth as needed. Use at the first sign of an angina attack (chest pain  or tightness). You can also take this medication 5 to 10 minutes before an event likely to produce chest pain. Follow the directions exactly as written on the prescription label. Place one tablet under your tongue and let it dissolve. Do not swallow whole. Replace the dose if you accidentally swallow it. It will help if your mouth is not dry. Saliva around the tablet will help it to dissolve more quickly. Do not eat or drink, smoke or chew tobacco while a tablet is dissolving. Sit down when taking this medication. In an angina attack, you should feel better within 5 minutes after your first dose. You  can take a dose every 5 minutes up to a total of 3 doses. If you do not feel better or feel worse after 1 dose, call 9-1-1 at once. Do not take more than 3 doses in 15 minutes. Your care team might give you other directions. Follow those directions if they do. Do not take your medication more often than directed. Talk to your care team about the use of this medication in children. Special care may be needed. Overdosage: If you think you have taken too much of this medicine contact a poison control center or emergency room at once. NOTE: This medicine is only for you. Do not share this medicine with others. What if I miss a dose? This does not apply. This medication is only used as needed. What may interact with this medication? Do not take this medication with any of the following: Certain migraine medications like ergotamine and dihydroergotamine (DHE) Medications used to treat erectile dysfunction like sildenafil, tadalafil, and vardenafil Riociguat This medication may also interact with the following: Alteplase Aspirin Heparin Medications for high blood pressure Medications for mental depression Other medications used to treat angina Phenothiazines like chlorpromazine, mesoridazine, prochlorperazine, thioridazine This list may not describe all possible interactions. Give your health care provider a list of all the medicines, herbs, non-prescription drugs, or dietary supplements you use. Also tell them if you smoke, drink alcohol, or use illegal drugs. Some items may interact with your medicine. What should I watch for while using this medication? Tell your care team if you feel your medication is no longer working. Keep this medication with you at all times. Sit or lie down when you take your medication to prevent falling if you feel dizzy or faint after using it. Try to remain calm. This will help you to feel better faster. If you feel dizzy, take several deep breaths and lie down with your  feet propped up, or bend forward with your head resting between your knees. You may get drowsy or dizzy. Do not drive, use machinery, or do anything that needs mental alertness until you know how this medication affects you. Do not stand or sit up quickly, especially if you are an older patient. This reduces the risk of dizzy or fainting spells. Alcohol can make you more drowsy and dizzy. Avoid alcoholic drinks. Do not treat yourself for coughs, colds, or pain while you are taking this medication without asking your care team for advice. Some ingredients may increase your blood pressure. What side effects may I notice from receiving this medication? Side effects that you should report to your care team as soon as possible: Allergic reactions--skin rash, itching, hives, swelling of the face, lips, tongue, or throat Headache, unusual weakness or fatigue, shortness of breath, nausea, vomiting, rapid heartbeat, blue skin or lips, which may be signs of methemoglobinemia Increased pressure around the brain--severe  headache, blurry vision, change in vision, nausea, vomiting Low blood pressure--dizziness, feeling faint or lightheaded, blurry vision Slow heartbeat--dizziness, feeling faint or lightheaded, confusion, trouble breathing, unusual weakness or fatigue Worsening chest pain (angina)--pain, pressure, or tightness in the chest, neck, back, or arms Side effects that usually do not require medical attention (report to your care team if they continue or are bothersome): Dizziness Flushing Headache This list may not describe all possible side effects. Call your doctor for medical advice about side effects. You may report side effects to FDA at 1-800-FDA-1088. Where should I keep my medication? Keep out of the reach of children. Store at room temperature between 20 and 25 degrees C (68 and 77 degrees F). Store in Retail buyer. Protect from light and moisture. Keep tightly closed. Throw away any  unused medication after the expiration date. NOTE: This sheet is a summary. It may not cover all possible information. If you have questions about this medicine, talk to your doctor, pharmacist, or health care provider.  2023 Elsevier/Gold Standard (2008-01-10 00:00:00)  Aspirin and Your Heart Aspirin is a medicine that prevents the platelets in your blood from sticking together. Platelets are the cells that your blood uses for clotting. Aspirin can be used to help reduce the risk of blood clots, heart attacks, and other heart-related problems. What are the risks? Daily use of aspirin can cause side effects. Some of these include: Bleeding. Bleeding can be minor or serious. An example of minor bleeding is bleeding from a cut, and the bleeding does not stop. An example of more serious bleeding is stomach bleeding or, rarely, bleeding into the brain. Your risk of bleeding increases if you are also taking NSAIDs, such as ibuprofen. Increased bruising. Upset stomach. An allergic reaction. People who have growths inside the nose (nasal polyps) have an increased risk of developing an aspirin allergy. How to use aspirin to care for your heart  Take aspirin only as told by your health care provider. Make sure that you understand how much to take and what form to take. The two forms of aspirin are: Non-enteric-coated.This type of aspirin does not have a coating and is absorbed quickly. This type of aspirin also comes in a chewable form. Enteric-coated. This type of aspirin has a coating that releases the medicine very slowly. Enteric-coated aspirin might cause less stomach upset than non-enteric-coated aspirin. This type of aspirin should not be chewed or crushed. Work with your health care provider to find out whether it is safe and beneficial for you to take aspirin daily. Taking aspirin daily may be helpful if: You have had a heart attack or chest pain, or you are at risk for a heart attack. You have a  condition in which certain heart vessels are blocked (coronary artery disease), and you have had a procedure to treat it. Examples are: Open-heart surgery, such as coronary artery bypass surgery (CABG). Coronary angioplasty,which is done to widen a blood vessel of your heart. Having a small mesh tube, or stent, placed in your coronary artery. You have had certain types of stroke or a mini-stroke known as a transient ischemic attack (TIA). You have a narrowing of the arteries that supply the limbs (peripheral vascular disease, or PVD). You have long-term (chronic) heart rhythm problems, such as atrial fibrillation, and your health care provider thinks aspirin may help. You have valve disease, have had a heart valve replacement, or have had surgery on a valve. You are considered at increased risk of developing coronary  artery disease or PVD. Follow these instructions at home Medicines Take over-the-counter and prescription medicines only as told by your health care provider. If you are taking blood thinners: Talk with your health care provider before you take any medicines that contain aspirin or NSAIDs, such as ibuprofen. These medicines increase your risk for dangerous bleeding. Take your medicine exactly as told, at the same time every day. Avoid activities that could cause injury or bruising, and follow instructions about how to prevent falls. Wear a medical alert bracelet or carry a card that lists what medicines you take. General instructions Do not drink alcohol if: Your health care provider tells you not to drink. You are pregnant, may be pregnant, or are planning to become pregnant. If you drink alcohol: Limit how much you have to: 0-1 drink a day for women. 0-2 drinks a day for men. Know how much alcohol is in your drink. In the [place] one drink equals one 12 oz bottle of beer (355 mL), one 5 oz glass of wine (148 mL), or one 1 oz glass of hard liquor (44 mL). Keep all follow-up  visits. This is important. Where to find more information The American Heart Association: www.heart.org The Centers for Disease Control and Prevention: FootballExhibition.com.brwww.cdc.gov Contact a health care provider if: You have unusual bleeding or bruising. You have stomach pain or you feel nauseous. You have ringing in your ears. You have an allergic reaction that causes hives, itchy skin, or swelling of the lips, tongue, or face. Get help right away if: Your bowel movements are bloody, dark red, or black. You vomit or cough up blood. You have blood in your urine. You have a cough, make high-pitched whistling sounds most often heard when you breathe out (wheeze), or feel short of breath. You have chest pain, especially if the pain spreads to your arms, back, neck, or jaw. You have any symptoms of a stroke. "BE FAST" is an easy way to remember the main warning signs of a stroke: B - Balance. Signs are dizziness, sudden trouble walking, or loss of balance. E - Eyes. Signs are trouble seeing or a sudden change in vision. F - Face. Signs are sudden weakness or numbness of the face, or the face or eyelid drooping on one side. A - Arms. Signs are weakness or numbness in an arm. This happens suddenly and usually on one side of the body. S - Speech. Signs are sudden trouble speaking, slurred speech, or trouble understanding what people say. T - Time. Time to call emergency services. Write down what time symptoms started. You have other signs of a stroke, such as: A sudden, severe headache with no known cause. Confusion. Nausea or vomiting. Seizure. These symptoms may represent a serious problem that is an emergency. Do not wait to see if the symptoms will go away. Get medical help right away. Call your local emergency services (911 in the [place] Do not drive yourself to the hospital. Summary Aspirin use can help reduce the risk of blood clots, heart attacks, and other heart-related problems. Daily use of aspirin  can cause side effects. Take aspirin only as told by your health care provider. Make sure that you understand how much to take and what form to take. Your health care provider will help you determine whether it is safe and beneficial for you to take aspirin daily. This information is not intended to replace advice given to you by your health care provider. Make sure you discuss any questions  you have with your health care provider. Document Revised: 01/21/2021 Document Reviewed: 01/21/2021 Elsevier Patient Education  2023 [employer].

## 2022-08-30 NOTE — Addendum Note (Signed)
Addended by: [name] on: 08/30/2022 04:01 PM   Modules accepted: Orders

## 2022-08-30 NOTE — Progress Notes (Signed)
Cardiology Consultation:    Date:  08/30/2022   ID:  DONLEY HARLAND, DOB 02/11/1981, MRN [id number]  PCP:  Ricard Dillon, NP  Cardiologist:  Jenne Campus, MD   Referring MD: Ricard Dillon, NP   Chief Complaint  Patient presents with   Abnormal ECG    Referred by Mathews Robinsons    History of Present Illness:    Benjamin Hernandez is a 41 y.o. male who is being seen today for the evaluation of abnormal EKG at the request of Tetter, Devin B, NP.  Past medical history significant for spontaneous pneumothorax more than 20 years ago, also history of some spinal fusion, he does not smoke he was referred to [place] because he went for regular physical he was find to have Q waves in V1 V2 and suspicion for anterior septal wall MI on top of that he complained of having some chest pain.  Pain happen in different situations he describes very sharp short lasting only split-second pain as well as lasting more up to 5 minutes.  It can happen with stressful situations and can happen at any time.  This may be some sweating associated with this sensation that being on for a while does not get any worse.  Walking climbing stairs usually does not bring that pain.  He does have family history of premature coronary artery disease his father apparently died with massive heart attack when he was in his 60s he does not exercise on the regular basis he is not on a special diet  Past Medical History:  Diagnosis Date   Adjustment disorder with depressed mood 09/11/2019   Back pain 09/11/2019   Cervical radiculopathy 01/30/2021   Numbness and tingling in both hands 01/30/2021   Spontaneous pneumothorax     Past Surgical History:  Procedure Laterality Date   CHEST TUBE INSERTION     LUNG SURGERY     NASAL POLYP SURGERY     TRANSFORAMINAL LUMBAR INTERBODY FUSION (TLIF) WITH PEDICLE SCREW FIXATION 1 LEVEL Left 09/09/2019   Procedure: LEFT-SIDED LUMBAR FIVE- SACRUM ONE TRANSFORAMINAL LUMBAR INTERBODY FUSION WITH  INSTRUMENTATION AND ALLOGRAFT;  Surgeon: Phylliss Bob, MD;  Location: [facility];  Service: Orthopedics;  Laterality: Left;    Current Medications: Current Meds  Medication Sig   escitalopram (LEXAPRO) 10 MG tablet Take 10 mg by mouth daily.   gabapentin (NEURONTIN) 100 MG capsule Take 100 mg by mouth 2 (two) times daily.   methocarbamol (ROBAXIN) 500 MG tablet Take 500 mg by mouth 2 (two) times daily as needed for muscle spasms.     Allergies:   Latex   Social History   Socioeconomic History   Marital status: Single    Spouse name: Not on file   Number of children: Not on file   Years of education: Not on file   Highest education level: Not on file  Occupational History   Not on file  Tobacco Use   Smoking status: Never   Smokeless tobacco: Never  Vaping Use   Vaping Use: Never used  Substance and Sexual Activity   Alcohol use: Yes    Comment: social   Drug use: Never   Sexual activity: Not on file  Other Topics Concern   Not on file  Social History Narrative   Not on file   Social Determinants of Health   Financial Resource Strain: Not on file  Food Insecurity: Not on file  Transportation Needs: Not on file  Physical Activity:  Not on file  Stress: Not on file  Social Connections: Not on file     Family History: The patient's family history includes Alcohol abuse in his mother; Heart disease in his father; Hypertension in his father; Throat cancer in his mother. ROS:   Please see the history of present illness.    All 14 point review of systems negative except as described per history of present illness.  EKGs/Labs/Other Studies Reviewed:    The following studies were reviewed today:   EKG:  EKG is  ordered today.  The ekg ordered today demonstrates normal sinus rhythm normal P interval, Q wave V1 V2  Recent Labs: No results found for requested labs within last 365 days.  Recent Lipid Panel No results found for: "CHOL", "TRIG", "HDL", "CHOLHDL", "VLDL",  "LDLCALC", "LDLDIRECT"  Physical Exam:    VS:  BP 134/82 (BP Location: Left Arm, Patient Position: Sitting)   Pulse 76   Ht 6\' 1"  (1.854 m)   Wt 238 lb (108 kg)   SpO2 96%   BMI 31.40 kg/m     Wt Readings from Last 3 Encounters:  08/30/22 238 lb (108 kg)  08/01/22 244 lb (110.7 kg)  09/09/19 217 lb 12.8 oz (98.8 kg)     GEN:  Well nourished, well developed in no acute distress HEENT: Normal NECK: No JVD; No carotid bruits LYMPHATICS: No lymphadenopathy CARDIAC: RRR, no murmurs, no rubs, no gallops RESPIRATORY:  Clear to auscultation without rales, wheezing or rhonchi  ABDOMEN: Soft, non-tender, non-distended MUSCULOSKELETAL:  No edema; No deformity  SKIN: Warm and dry NEUROLOGIC:  Alert and oriented x 3 PSYCHIATRIC:  Normal affect   ASSESSMENT:    1. Atypical chest pain   2. Nonspecific abnormal electrocardiogram (ECG) (EKG)   3. History of pneumothorax   4. Obstructive sleep apnea    PLAN:    In order of problems listed above:  Abnormal EKG raising suspicion for anteroseptal wall MI.  Echocardiogram will be done to assess ejection fraction and segmental wall motion of normalities.  If he truly does have anterior septal wall MI then cardiac catheterization will be warranted if not we will pursue coronary CT angio.  Ask him to start taking 1 baby aspirin every single day.  I will give him also prescription for nitroglycerin Atypical chest pain.  Plan will be to wait for echocardiogram to decide how we can evaluate him for coronary artery disease, I suspect coronary CT angio will be noted. He may have he just had cholesterol done by his primary care physician he was told everything is fine we will call and get a copy of it I strongly suspect he got sleep apnea.  He is in the office with his wife and she tells me that he stop breathing at night and up to one 1 minute.  We will do sleep study   Medication Adjustments/Labs and Tests Ordered: Current medicines are reviewed  at length with the patient today.  Concerns regarding medicines are outlined above.  No orders of the defined types were placed in this encounter.  No orders of the defined types were placed in this encounter.   Signed, Park Liter, MD, [facility]. 08/30/2022 3:50 PM    [facility]

## 2022-08-31 ENCOUNTER — Telehealth: Payer: Self-pay | Admitting: Cardiology

## 2022-08-31 MED ORDER — NITROGLYCERIN 0.4 MG SL SUBL: 0.4000 mg | SUBLINGUAL_TABLET | SUBLINGUAL | 6 refills | Status: AC | PRN

## 2022-08-31 NOTE — Telephone Encounter (Signed)
Pt c/o medication issue:  1. Name of Medication:   nitroGLYCERIN (NITROSTAT) 0.4 MG SL tablet   2. How are you currently taking this medication (dosage and times per day)? Place 1 tablet (0.4 mg total) under the tongue every 5 (five) minutes as needed.  3. Are you having a reaction (difficulty breathing--STAT)?   4. What is your medication issue? Pt wants prescription called into   Pharmacy at [id number] [address] [place], [place] [address]

## 2022-08-31 NOTE — Addendum Note (Signed)
Addended by: Jacobo Forest D on: 08/31/2022 12:36 PM   Modules accepted: Orders

## 2022-09-05 ENCOUNTER — Telehealth: Payer: Self-pay | Admitting: *Deleted

## 2022-09-05 NOTE — Telephone Encounter (Signed)
Per [employer] no PA is required for Benjamin Hernandez (CPT [id number]). Call reference # [id number]. Staff message sent to [name] ok to activate device.

## 2022-09-06 ENCOUNTER — Telehealth: Payer: Self-pay | Admitting: Cardiology

## 2022-09-06 NOTE — Telephone Encounter (Signed)
Spoke with pt. Benjamin Hernandez auth by Ins per Barry Brunner. Device registered. Stop Bang score obtained from pt. Pt aware device is now registered.

## 2022-09-06 NOTE — Telephone Encounter (Signed)
Patient called to report he received is sleep device but his serial number has not been registered.  Patient would like to know his next steps.

## 2022-09-10 ENCOUNTER — Encounter (HOSPITAL_BASED_OUTPATIENT_CLINIC_OR_DEPARTMENT_OTHER): Payer: BC Managed Care – PPO | Admitting: Cardiology

## 2022-09-10 DIAGNOSIS — G4733 Obstructive sleep apnea (adult) (pediatric): Secondary | ICD-10-CM | POA: Diagnosis not present

## 2022-09-11 NOTE — Procedures (Signed)
atient Information Study Date: 09/10/22 Patient Name: Benjamin Hernandez Patient ID: [id number] Birth Date: Dec 03, 1981 Age: 41 Gender: Male BMI: 31.6 (W=238 lb, H=6' 1'') Referring Physician: Gypsy Balsam, MD  TEST DESCRIPTION: Home sleep apnea testing was completed using the WatchPat, a Type 1 device, utilizing peripheral arterial tonometry (PAT), chest movement, actigraphy, pulse oximetry, pulse rate, body position and snore.  AHI was calculated with apnea and hypopnea using valid sleep time as the denominator. RDI includes apneas, hypopneas, and RERAs.  The data acquired and the scoring of sleep and all associated events were performed in accordance with the recommended standards and specifications as outlined in the AASM Manual for the Scoring of Sleep and Associated Events 2.2.0 (2015).  FINDINGS:  1.  Severe Obstructive Sleep Apnea with AHI 32.2/hr.   2.  No Central Sleep Apnea with pAHIc 2.3/hr.  3.  Oxygen desaturations as low as 84%.  4.  Severe snoring was present. O2 sats were < 88% for 3.3 min.  5.  Total sleep time was 8 hrs and 35 min.  6.  19.5% of total sleep time was spent in REM sleep.   7.   sleep onset latency at 6 min.   8.   REM sleep onset latency at 261 min.   9.  Total awakenings were 13.  10. Arrhythmia detection:  Suggestive of possible brief atrial fibrillation lasting 1 minutes and 44 seconds.  This is not diagnostic and further testing with outpatient telemetry monitoring is recommended.  DIAGNOSIS:   Severe Obstructive Sleep Apnea (G47.33) Possible Atrial Fibrillation  RECOMMENDATIONS:   1.  Clinical correlation of these findings is necessary.  The decision to treat obstructive sleep apnea (OSA) is usually based on the presence of apnea symptoms or the presence of associated medical conditions such as Hypertension, Congestive Heart Failure, Atrial Fibrillation or Obesity.  The most common symptoms of OSA are snoring, gasping for breath while  sleeping, daytime sleepiness and fatigue.   2.  Initiating apnea therapy is recommended given the presence of symptoms and/or associated conditions. Recommend proceeding with one of the following:     a.  Auto-CPAP therapy with a pressure range of 5-20cm H2O.     b.  An oral appliance (OA) that can be obtained from certain dentists with expertise in sleep medicine.  These are primarily of use in non-obese patients with mild and moderate disease.     c.  An ENT consultation which may be useful to look for specific causes of obstruction and possible treatment options.     d.  If patient is intolerant to PAP therapy, consider referral to ENT for evaluation for hypoglossal nerve stimulator.   3.  Close follow-up is necessary to ensure success with CPAP or oral appliance therapy for maximum benefit.  4.  A follow-up oximetry study on CPAP is recommended to assess the adequacy of therapy and determine the need for supplemental oxygen or the potential need for Bi-level therapy.  An arterial blood gas to determine the adequacy of baseline ventilation and oxygenation should also be considered.  5.  Healthy sleep recommendations include:  adequate nightly sleep (normal 7-9 hrs/night), avoidance of caffeine after noon and alcohol near bedtime, and maintaining a sleep environment that is cool, dark and quiet.  6.  Weight loss for overweight patients is recommended.  Even modest amounts of weight loss can significantly improve the severity of sleep apnea.  7.  Snoring recommendations include:  weight loss where appropriate,  side sleeping, and avoidance of alcohol before bed.  8.  Operation of motor vehicle should be avoided when sleepy.  9.  If patient has no history of atrial fibrillation, recommend outpatient heart monitor.  Signature: Fransico Him, MD; [facility]; [place], [place] Board of Sleep Medicine Electronically Signed: 09/12/22

## 2022-09-12 ENCOUNTER — Ambulatory Visit: Payer: BC Managed Care – PPO | Attending: Cardiology

## 2022-09-12 DIAGNOSIS — R0789 Other chest pain: Secondary | ICD-10-CM

## 2022-09-12 DIAGNOSIS — R9431 Abnormal electrocardiogram [ECG] [EKG]: Secondary | ICD-10-CM

## 2022-09-12 DIAGNOSIS — G4733 Obstructive sleep apnea (adult) (pediatric): Secondary | ICD-10-CM

## 2022-09-12 LAB — ECHOCARDIOGRAM COMPLETE
Area-P 1/2: 3.65 cm2
Calc EF: 51.3 %
S' Lateral: 3.3 cm
Single Plane A2C EF: 52.7 %
Single Plane A4C EF: 49.3 %

## 2022-09-12 MED ORDER — PERFLUTREN LIPID MICROSPHERE
1.0000 mL | INTRAVENOUS | Status: AC | PRN
  Administered 2022-09-12: 6 mL via INTRAVENOUS

## 2022-09-20 ENCOUNTER — Telehealth: Payer: Self-pay

## 2022-09-20 NOTE — Telephone Encounter (Signed)
Results reviewed with pt as per Dr. Krasowski's note.  Pt verbalized understanding and had no additional questions. Routed to PCP  

## 2022-09-21 ENCOUNTER — Other Ambulatory Visit: Payer: Self-pay | Admitting: Cardiology

## 2022-09-21 ENCOUNTER — Telehealth: Payer: Self-pay | Admitting: *Deleted

## 2022-09-21 DIAGNOSIS — G4733 Obstructive sleep apnea (adult) (pediatric): Secondary | ICD-10-CM

## 2022-09-21 NOTE — Telephone Encounter (Signed)
-----   Message from Sueanne Margarita, MD sent at 09/11/2022  8:27 PM EDT ----- Please let patient know that they have sleep apnea.  Recommend therapeutic CPAP titration for treatment of patient's sleep disordered breathing.  If unable to perform an in lab titration then initiate ResMed auto CPAP from 4 to 15cm H2O with heated humidity and mask of choice and overnight pulse ox on CPAP.

## 2022-09-21 NOTE — Telephone Encounter (Signed)
Patient notified of sleep study results and recommendations. He agrees to CPAP titration pending insurance approval. He had no questions when asked.

## 2022-09-27 ENCOUNTER — Telehealth: Payer: Self-pay | Admitting: *Deleted

## 2022-09-27 NOTE — Telephone Encounter (Signed)
Per [employer] no PA is required for CPT 95811 (CPAP titration ). Call reference # [id number].

## 2022-10-11 ENCOUNTER — Encounter: Payer: Self-pay | Admitting: Cardiology

## 2022-10-11 ENCOUNTER — Telehealth: Payer: Self-pay | Admitting: Cardiology

## 2022-10-11 ENCOUNTER — Ambulatory Visit: Payer: BC Managed Care – PPO | Attending: Cardiology | Admitting: Cardiology

## 2022-10-11 VITALS — BP 130/94 | HR 82 | Ht 73.0 in | Wt 241.0 lb

## 2022-10-11 DIAGNOSIS — R9431 Abnormal electrocardiogram [ECG] [EKG]: Secondary | ICD-10-CM

## 2022-10-11 DIAGNOSIS — M5412 Radiculopathy, cervical region: Secondary | ICD-10-CM | POA: Diagnosis not present

## 2022-10-11 DIAGNOSIS — Z8709 Personal history of other diseases of the respiratory system: Secondary | ICD-10-CM | POA: Diagnosis not present

## 2022-10-11 DIAGNOSIS — R0789 Other chest pain: Secondary | ICD-10-CM | POA: Diagnosis not present

## 2022-10-11 DIAGNOSIS — G4733 Obstructive sleep apnea (adult) (pediatric): Secondary | ICD-10-CM | POA: Diagnosis not present

## 2022-10-11 DIAGNOSIS — R072 Precordial pain: Secondary | ICD-10-CM

## 2022-10-11 MED ORDER — METOPROLOL TARTRATE 100 MG PO TABS: 100.0000 mg | ORAL_TABLET | Freq: Once | ORAL | 0 refills | Status: AC

## 2022-10-11 MED ORDER — METOPROLOL TARTRATE 100 MG PO TABS: 100.0000 mg | ORAL_TABLET | Freq: Once | ORAL | 0 refills | Status: DC

## 2022-10-11 NOTE — Telephone Encounter (Signed)
error 

## 2022-10-11 NOTE — Telephone Encounter (Signed)
Medication sent to correct pharmacy  

## 2022-10-11 NOTE — Patient Instructions (Signed)
Medication Instructions:  Your physician recommends that you continue on your current medications as directed. Please refer to the Current Medication list given to you today.  *If you need a refill on your cardiac medications before your next appointment, please call your pharmacy*   Lab Work: NONE If you have labs (blood work) drawn today and your tests are completely normal, you will receive your results only by: MyChart Message (if you have MyChart) OR A paper copy in the mail If you have any lab test that is abnormal or we need to change your treatment, we will call you to review the results.   Testing/Procedures:  Your cardiac CT will be scheduled at one of the below locations:   [facility] [address] [place], [place] [address] [phone]   If scheduled at [facility], please arrive at the [facility] and Children's Entrance (Entrance C2) of [facility] 30 minutes prior to test start time. You can use the FREE valet parking offered at entrance C (encouraged to control the heart rate for the test)  Proceed to the [facility] Radiology Department (first floor) to check-in and test prep.  All radiology patients and guests should use entrance C2 at [facility], accessed from [facility], even though the hospital's physical address listed is [address].      Please follow these instructions carefully (unless otherwise directed):  Hold all erectile dysfunction medications at least 3 days (72 hrs) prior to test.  On the Night Before the Test: Be sure to Drink plenty of water. Do not consume any caffeinated/decaffeinated beverages or chocolate 12 hours prior to your test. Do not take any antihistamines 12 hours prior to your test. On the Day of the Test: Drink plenty of water until 1 hour prior to the test. Do not eat any food 4 hours prior to the test. You may take your regular medications prior to the test.   Take metoprolol (Lopressor) two hours prior to test.         After the Test: Drink plenty of water. After receiving IV contrast, you may experience a mild flushed feeling. This is normal. On occasion, you may experience a mild rash up to 24 hours after the test. This is not dangerous. If this occurs, you can take Benadryl 25 mg and increase your fluid intake. If you experience trouble breathing, this can be serious. If it is severe call 911 IMMEDIATELY. If it is mild, please call our office. We will call to schedule your test 2-4 weeks out understanding that some insurance companies will need an authorization prior to the service being performed.   For non-scheduling related questions, please contact the cardiac imaging nurse navigator should you have any questions/concerns: Rockwell Alexandria, Cardiac Imaging Nurse Navigator Larey Brick, Cardiac Imaging Nurse Navigator [facility] Direct Office Dial: [phone]   For scheduling needs, including cancellations and rescheduling, please call [place], [phone].    Follow-Up: At [facility], you and your health needs are our priority.  As part of our continuing mission to provide you with exceptional heart care, we have created designated Provider Care Teams.  These Care Teams include your primary Cardiologist (physician) and Advanced Practice Providers (APPs -  Physician Assistants and Nurse Practitioners) who all work together to provide you with the care you need, when you need it.  We recommend signing up for the patient portal called "MyChart".  Sign up information  is provided on this After Visit Summary.  MyChart is used to connect with patients for Virtual Visits (Telemedicine).  Patients are able to view lab/test results, encounter notes, upcoming appointments, etc.  Non-urgent messages can be sent to your provider as well.   To learn more about what you can do with MyChart, go to  [email or web address].    Your next appointment:   3 month(s)  The format for your next appointment:   In Person  Provider:   Jenne Campus, MD    Other Instructions Cardiac CT Angiogram A cardiac CT angiogram is a procedure to look at the heart and the area around the heart. It may be done to help find the cause of chest pains or other symptoms of heart disease. During this procedure, a substance called contrast dye is injected into the blood vessels in the area to be checked. A large X-ray machine, called a CT scanner, then takes detailed pictures of the heart and the surrounding area. The procedure is also sometimes called a coronary CT angiogram, coronary artery scanning, or CTA. A cardiac CT angiogram allows the health care provider to see how well blood is flowing to and from the heart. The health care provider will be able to see if there are any problems, such as: Blockage or narrowing of the coronary arteries in the heart. Fluid around the heart. Signs of weakness or disease in the muscles, valves, and tissues of the heart. Tell a health care provider about: Any allergies you have. This is especially important if you have had a previous allergic reaction to contrast dye. All medicines you are taking, including vitamins, herbs, eye drops, creams, and over-the-counter medicines. Any blood disorders you have. Any surgeries you have had. Any medical conditions you have. Whether you are pregnant or may be pregnant. Any anxiety disorders, chronic pain, or other conditions you have that may increase your stress or prevent you from lying still. What are the risks? Generally, this is a safe procedure. However, problems may occur, including: Bleeding. Infection. Allergic reactions to medicines or dyes. Damage to other structures or organs. Kidney damage from the contrast dye that is used. Increased risk of cancer from radiation exposure. This risk is low. Talk with your health  care provider about: The risks and benefits of testing. How you can receive the lowest dose of radiation. What happens before the procedure? Wear comfortable clothing and remove any jewelry, glasses, dentures, and hearing aids. Follow instructions from your health care provider about eating and drinking. This may include: For 12 hours before the procedure -- avoid caffeine. This includes tea, coffee, soda, energy drinks, and diet pills. Drink plenty of water or other fluids that do not have caffeine in them. Being well hydrated can prevent complications. For 4-6 hours before the procedure -- stop eating and drinking. The contrast dye can cause nausea, but this is less likely if your stomach is empty. Ask your health care provider about changing or stopping your regular medicines. This is especially important if you are taking diabetes medicines, blood thinners, or medicines to treat problems with erections (erectile dysfunction). What happens during the procedure?  Hair on your chest may need to be removed so that small sticky patches called electrodes can be placed on your chest. These will transmit information that helps to monitor your heart during the procedure. An IV will be inserted into one of your veins. You might be given a medicine to control your heart rate during the procedure.  This will help to ensure that good images are obtained. You will be asked to lie on an exam table. This table will slide in and out of the CT machine during the procedure. Contrast dye will be injected into the IV. You might feel warm, or you may get a metallic taste in your mouth. You will be given a medicine called nitroglycerin. This will relax or dilate the arteries in your heart. The table that you are lying on will move into the CT machine tunnel for the scan. The person running the machine will give you instructions while the scans are being done. You may be asked to: Keep your arms above your head. Hold  your breath. Stay very still, even if the table is moving. When the scanning is complete, you will be moved out of the machine. The IV will be removed. The procedure may vary among health care providers and hospitals. What can I expect after the procedure? After your procedure, it is common to have: A metallic taste in your mouth from the contrast dye. A feeling of warmth. A headache from the nitroglycerin. Follow these instructions at home: Take over-the-counter and prescription medicines only as told by your health care provider. If you are told, drink enough fluid to keep your urine pale yellow. This will help to flush the contrast dye out of your body. Most people can return to their normal activities right after the procedure. Ask your health care provider what activities are safe for you. It is up to you to get the results of your procedure. Ask your health care provider, or the department that is doing the procedure, when your results will be ready. Keep all follow-up visits as told by your health care provider. This is important. Contact a health care provider if: You have any symptoms of allergy to the contrast dye. These include: Shortness of breath. Rash or hives. A racing heartbeat. Summary A cardiac CT angiogram is a procedure to look at the heart and the area around the heart. It may be done to help find the cause of chest pains or other symptoms of heart disease. During this procedure, a large X-ray machine, called a CT scanner, takes detailed pictures of the heart and the surrounding area after a contrast dye has been injected into blood vessels in the area. Ask your health care provider about changing or stopping your regular medicines before the procedure. This is especially important if you are taking diabetes medicines, blood thinners, or medicines to treat erectile dysfunction. If you are told, drink enough fluid to keep your urine pale yellow. This will help to flush  the contrast dye out of your body. This information is not intended to replace advice given to you by your health care provider. Make sure you discuss any questions you have with your health care provider. Document Revised: 03/08/2022 Document Reviewed: 07/15/2019 Elsevier Patient Education  [place]

## 2022-10-11 NOTE — Telephone Encounter (Signed)
Pt c/o medication issue:  1. Name of Medication: metoprolol tartrate (LOPRESSOR) 100 MG tablet   2. How are you currently taking this medication (dosage and times per day)?   3. Are you having a reaction (difficulty breathing--STAT)?   4. What is your medication issue? Pt's wife says this was sent to the wrong pharmacy and needs it sent to:   [id number] - [place], [place] - [address]

## 2022-10-11 NOTE — Progress Notes (Signed)
Cardiology Office Note:    Date:  10/11/2022   ID:  [name], DOB [birth year], MRN [id number]  PCP:  Lars Mage, NP  Cardiologist:  Gypsy Balsam, MD    Referring MD: Lars Mage, NP   Chief Complaint  Patient presents with   Follow-up    History of Present Illness:    Benjamin Hernandez is a 41 y.o. male with past medical history significant for spontaneous pneumothorax that he suffered from about 20 years ago, history of spinal fusion, he is EKG showed Q waves in V1 V2 and suspicion for anterior wall myocardial infarction, after that echocardiogram done show no evidence of segmental wall motion abnormality.  He described different kind of chest pain some tightness squeezing pressure sometimes with exercise worse while he does things.  The plan was to check his echocardiogram if there would be no segmental motion abnormality then we could proceed with coronary CT angio.  He is ready to proceed  Past Medical History:  Diagnosis Date   Adjustment disorder with depressed mood 09/11/2019   Back pain 09/11/2019   Cervical radiculopathy 01/30/2021   Numbness and tingling in both hands 01/30/2021   Spontaneous pneumothorax     Past Surgical History:  Procedure Laterality Date   CHEST TUBE INSERTION     LUNG SURGERY     NASAL POLYP SURGERY     TRANSFORAMINAL LUMBAR INTERBODY FUSION (TLIF) WITH PEDICLE SCREW FIXATION 1 LEVEL Left 09/09/2019   Procedure: LEFT-SIDED LUMBAR FIVE- SACRUM ONE TRANSFORAMINAL LUMBAR INTERBODY FUSION WITH INSTRUMENTATION AND ALLOGRAFT;  Surgeon: Estill Bamberg, MD;  Location: [facility] OR;  Service: Orthopedics;  Laterality: Left;    Current Medications: Current Meds  Medication Sig   aspirin EC 81 MG tablet Take 1 tablet (81 mg total) by mouth daily. Swallow whole.   escitalopram (LEXAPRO) 20 MG tablet Take 20 mg by mouth daily.   gabapentin (NEURONTIN) 100 MG capsule Take 100 mg by mouth 2 (two) times daily.   methocarbamol (ROBAXIN) 500 MG tablet  Take 500 mg by mouth 2 (two) times daily as needed for muscle spasms.   nitroGLYCERIN (NITROSTAT) 0.4 MG SL tablet Place 1 tablet (0.4 mg total) under the tongue every 5 (five) minutes as needed.     Allergies:   Latex   Social History   Socioeconomic History   Marital status: Single    Spouse name: Not on file   Number of children: Not on file   Years of education: Not on file   Highest education level: Not on file  Occupational History   Not on file  Tobacco Use   Smoking status: Never   Smokeless tobacco: Never  Vaping Use   Vaping Use: Never used  Substance and Sexual Activity   Alcohol use: Yes    Comment: social   Drug use: Never   Sexual activity: Not on file  Other Topics Concern   Not on file  Social History Narrative   Not on file   Social Determinants of Health   Financial Resource Strain: Not on file  Food Insecurity: Not on file  Transportation Needs: Not on file  Physical Activity: Not on file  Stress: Not on file  Social Connections: Not on file     Family History: The patient's family history includes Alcohol abuse in his mother; Heart disease in his father; Hypertension in his father; Throat cancer in his mother. ROS:   Please see the history of present illness.  All 14 point review of systems negative except as described per history of present illness  EKGs/Labs/Other Studies Reviewed:      Recent Labs: No results found for requested labs within last 365 days.  Recent Lipid Panel No results found for: "CHOL", "TRIG", "HDL", "CHOLHDL", "VLDL", "LDLCALC", "LDLDIRECT"  Physical Exam:    VS:  BP (!) 130/94 (BP Location: Left Arm, Patient Position: Sitting, Cuff Size: Normal)   Pulse 82   Ht 6\' 1"  (1.854 m)   Wt 241 lb (109.3 kg)   SpO2 95%   BMI 31.80 kg/m     Wt Readings from Last 3 Encounters:  10/11/22 241 lb (109.3 kg)  08/30/22 238 lb (108 kg)  08/01/22 244 lb (110.7 kg)     GEN:  Well nourished, well developed in no acute  distress HEENT: Normal NECK: No JVD; No carotid bruits LYMPHATICS: No lymphadenopathy CARDIAC: RRR, no murmurs, no rubs, no gallops RESPIRATORY:  Clear to auscultation without rales, wheezing or rhonchi  ABDOMEN: Soft, non-tender, non-distended MUSCULOSKELETAL:  No edema; No deformity  SKIN: Warm and dry LOWER EXTREMITIES: no swelling NEUROLOGIC:  Alert and oriented x 3 PSYCHIATRIC:  Normal affect   ASSESSMENT:    1. Atypical chest pain   2. Obstructive sleep apnea   3. History of pneumothorax   4. Cervical radiculopathy   5. Nonspecific abnormal electrocardiogram (ECG) (EKG)    PLAN:    In order of problems listed above:  Atypical chest pain.  We will schedule him to have coronary CT angio. Obstructive sleep apnea: He is waiting for titration of equipment. History of pneumothorax.  No new more problem from that aspect but he will have coronary CT angio will have a chance to look at his lung parenchyma. Nonspecific EKG changes.  Interestingly EKG today was normal.   Medication Adjustments/Labs and Tests Ordered: Current medicines are reviewed at length with the patient today.  Concerns regarding medicines are outlined above.  Orders Placed This Encounter  Procedures   EKG 12-Lead   Medication changes: No orders of the defined types were placed in this encounter.   Signed, Park Liter, MD, [facility] 10/11/2022 10:21 AM    [place]

## 2022-10-23 ENCOUNTER — Telehealth (HOSPITAL_COMMUNITY): Payer: Self-pay | Admitting: Emergency Medicine

## 2022-10-23 NOTE — Telephone Encounter (Signed)
Error-pt cancelled ccta appt

## 2022-10-24 ENCOUNTER — Ambulatory Visit (HOSPITAL_COMMUNITY): Payer: BC Managed Care – PPO

## 2022-11-01 ENCOUNTER — Encounter (HOSPITAL_BASED_OUTPATIENT_CLINIC_OR_DEPARTMENT_OTHER): Payer: BC Managed Care – PPO | Admitting: Cardiology

## 2023-01-24 ENCOUNTER — Ambulatory Visit: Payer: BC Managed Care – PPO | Admitting: Cardiology

## 2023-01-24 ENCOUNTER — Telehealth (HOSPITAL_COMMUNITY): Payer: Self-pay | Admitting: *Deleted

## 2023-01-24 NOTE — Telephone Encounter (Signed)
Reaching out to patient to offer assistance regarding upcoming cardiac imaging study; pt verbalizes understanding of appt date/time, parking situation and where to check in, pre-test NPO status and medications ordered, and verified current allergies; name and call back number provided for further questions should they arise  Gordy Clement RN Navigator [facility] [place] [facility] [phone] office [phone] cell  Patient to take 170m metoprolol tartrate two hours prior to his cardiac CT scan. He is aware to arrive at 12 pm.

## 2023-01-25 ENCOUNTER — Ambulatory Visit (HOSPITAL_COMMUNITY)
Admission: RE | Admit: 2023-01-25 | Discharge: 2023-01-25 | Disposition: A | Discharge status: Home / Self Care | Payer: BC Managed Care – PPO | Source: Ambulatory Visit | Attending: Cardiology | Admitting: Cardiology

## 2023-01-25 DIAGNOSIS — R072 Precordial pain: Secondary | ICD-10-CM | POA: Diagnosis present

## 2023-01-25 MED ORDER — NITROGLYCERIN 0.4 MG SL SUBL
0.8000 mg | SUBLINGUAL_TABLET | Freq: Once | SUBLINGUAL | Status: AC
  Administered 2023-01-25: 0.8 mg via SUBLINGUAL

## 2023-01-25 MED ORDER — NITROGLYCERIN 0.4 MG SL SUBL
SUBLINGUAL_TABLET | SUBLINGUAL | Status: AC
  Filled 2023-01-25: qty 2

## 2023-01-25 MED ORDER — IOHEXOL 350 MG/ML SOLN
100.0000 mL | Freq: Once | INTRAVENOUS | Status: AC | PRN
  Administered 2023-01-25: 100 mL via INTRAVENOUS

## 2023-04-12 ENCOUNTER — Ambulatory Visit: Payer: BC Managed Care – PPO | Admitting: Cardiology
